# Patient Record
Sex: Male | Born: 1940 | Race: White | Hispanic: No | Marital: Married | Smoking: Never smoker
Health system: Southern US, Community
[De-identification: ages and names within clinical notes are randomized; demographics above are authoritative.]

## PROBLEM LIST (undated history)

## (undated) DIAGNOSIS — N400 Enlarged prostate without lower urinary tract symptoms: Secondary | ICD-10-CM

## (undated) DIAGNOSIS — Z9289 Personal history of other medical treatment: Secondary | ICD-10-CM

## (undated) DIAGNOSIS — E119 Type 2 diabetes mellitus without complications: Secondary | ICD-10-CM

## (undated) DIAGNOSIS — E785 Hyperlipidemia, unspecified: Secondary | ICD-10-CM

## (undated) DIAGNOSIS — I2510 Atherosclerotic heart disease of native coronary artery without angina pectoris: Secondary | ICD-10-CM

## (undated) DIAGNOSIS — E039 Hypothyroidism, unspecified: Secondary | ICD-10-CM

## (undated) DIAGNOSIS — E669 Obesity, unspecified: Secondary | ICD-10-CM

## (undated) DIAGNOSIS — M1990 Unspecified osteoarthritis, unspecified site: Secondary | ICD-10-CM

## (undated) DIAGNOSIS — G4730 Sleep apnea, unspecified: Secondary | ICD-10-CM

## (undated) HISTORY — DX: Type 2 diabetes mellitus without complications: E11.9

## (undated) HISTORY — DX: Hyperlipidemia, unspecified: E78.5

## (undated) HISTORY — PX: JOINT REPLACEMENT: SHX530

## (undated) HISTORY — DX: Personal history of other medical treatment: Z92.89

## (undated) HISTORY — DX: Atherosclerotic heart disease of native coronary artery without angina pectoris: I25.10

## (undated) HISTORY — DX: Benign prostatic hyperplasia without lower urinary tract symptoms: N40.0

## (undated) HISTORY — DX: Obesity, unspecified: E66.9

## (undated) HISTORY — PX: CORONARY ARTERY BYPASS GRAFT: SHX141

## (undated) HISTORY — DX: Hypothyroidism, unspecified: E03.9

---

## 1984-12-25 HISTORY — PX: LUMBAR SPINE SURGERY: SHX701

## 1991-07-27 HISTORY — PX: CARDIAC CATHETERIZATION: SHX172

## 1998-06-11 ENCOUNTER — Ambulatory Visit (HOSPITAL_COMMUNITY): Admission: RE | Admit: 1998-06-11 | Discharge: 1998-06-11 | Payer: Self-pay | Admitting: Cardiology

## 1998-11-25 ENCOUNTER — Ambulatory Visit (HOSPITAL_COMMUNITY): Admission: RE | Admit: 1998-11-25 | Discharge: 1998-11-26 | Payer: Self-pay | Admitting: Cardiology

## 1998-11-25 HISTORY — PX: CARDIAC CATHETERIZATION: SHX172

## 1998-12-23 ENCOUNTER — Encounter: Payer: Self-pay | Admitting: Cardiothoracic Surgery

## 1998-12-27 ENCOUNTER — Encounter: Payer: Self-pay | Admitting: Cardiothoracic Surgery

## 1998-12-27 ENCOUNTER — Inpatient Hospital Stay (HOSPITAL_COMMUNITY): Admission: RE | Admit: 1998-12-27 | Discharge: 1999-01-01 | Payer: Self-pay | Admitting: Cardiothoracic Surgery

## 1998-12-28 ENCOUNTER — Encounter: Payer: Self-pay | Admitting: Cardiothoracic Surgery

## 1998-12-29 ENCOUNTER — Encounter: Payer: Self-pay | Admitting: Cardiothoracic Surgery

## 1998-12-30 ENCOUNTER — Encounter: Payer: Self-pay | Admitting: Cardiothoracic Surgery

## 1999-02-01 ENCOUNTER — Encounter (HOSPITAL_COMMUNITY): Admission: RE | Admit: 1999-02-01 | Discharge: 1999-05-02 | Payer: Self-pay | Admitting: Cardiology

## 2006-09-10 DIAGNOSIS — Z9289 Personal history of other medical treatment: Secondary | ICD-10-CM

## 2006-09-10 HISTORY — DX: Personal history of other medical treatment: Z92.89

## 2007-02-10 ENCOUNTER — Emergency Department (HOSPITAL_COMMUNITY): Admission: EM | Admit: 2007-02-10 | Discharge: 2007-02-10 | Payer: Self-pay | Admitting: Emergency Medicine

## 2007-12-26 HISTORY — PX: KNEE ARTHROSCOPY: SHX127

## 2011-12-15 DIAGNOSIS — Z9289 Personal history of other medical treatment: Secondary | ICD-10-CM

## 2011-12-15 HISTORY — DX: Personal history of other medical treatment: Z92.89

## 2012-02-08 ENCOUNTER — Encounter: Payer: Self-pay | Admitting: *Deleted

## 2012-02-08 DIAGNOSIS — E7800 Pure hypercholesterolemia, unspecified: Secondary | ICD-10-CM | POA: Insufficient documentation

## 2012-02-08 DIAGNOSIS — E669 Obesity, unspecified: Secondary | ICD-10-CM | POA: Insufficient documentation

## 2012-02-08 DIAGNOSIS — E119 Type 2 diabetes mellitus without complications: Secondary | ICD-10-CM

## 2012-02-08 DIAGNOSIS — E1169 Type 2 diabetes mellitus with other specified complication: Secondary | ICD-10-CM | POA: Insufficient documentation

## 2012-02-08 DIAGNOSIS — E039 Hypothyroidism, unspecified: Secondary | ICD-10-CM

## 2012-02-08 DIAGNOSIS — N400 Enlarged prostate without lower urinary tract symptoms: Secondary | ICD-10-CM | POA: Insufficient documentation

## 2012-02-08 DIAGNOSIS — E785 Hyperlipidemia, unspecified: Secondary | ICD-10-CM

## 2012-02-08 DIAGNOSIS — I2510 Atherosclerotic heart disease of native coronary artery without angina pectoris: Secondary | ICD-10-CM | POA: Insufficient documentation

## 2012-02-24 ENCOUNTER — Ambulatory Visit (INDEPENDENT_AMBULATORY_CARE_PROVIDER_SITE_OTHER): Payer: BC Managed Care – PPO | Admitting: Family Medicine

## 2012-02-24 DIAGNOSIS — E039 Hypothyroidism, unspecified: Secondary | ICD-10-CM

## 2012-02-24 DIAGNOSIS — E119 Type 2 diabetes mellitus without complications: Secondary | ICD-10-CM

## 2012-02-24 DIAGNOSIS — I2510 Atherosclerotic heart disease of native coronary artery without angina pectoris: Secondary | ICD-10-CM

## 2012-02-24 DIAGNOSIS — J440 Chronic obstructive pulmonary disease with acute lower respiratory infection: Secondary | ICD-10-CM

## 2012-02-24 DIAGNOSIS — E785 Hyperlipidemia, unspecified: Secondary | ICD-10-CM

## 2012-02-24 LAB — COMPREHENSIVE METABOLIC PANEL
ALT: 23 U/L (ref 0–53)
AST: 20 U/L (ref 0–37)
Chloride: 102 mEq/L (ref 96–112)
Creat: 1.06 mg/dL (ref 0.50–1.35)
Sodium: 141 mEq/L (ref 135–145)
Total Bilirubin: 0.6 mg/dL (ref 0.3–1.2)
Total Protein: 6.9 g/dL (ref 6.0–8.3)

## 2012-02-24 LAB — POCT GLYCOSYLATED HEMOGLOBIN (HGB A1C): Hemoglobin A1C: 8.8

## 2012-02-24 LAB — LIPID PANEL
LDL Cholesterol: 73 mg/dL (ref 0–99)
Total CHOL/HDL Ratio: 3.4 Ratio
VLDL: 14 mg/dL (ref 0–40)

## 2012-02-24 LAB — TSH: TSH: 1.996 u[IU]/mL (ref 0.350–4.500)

## 2012-02-24 LAB — MICROALBUMIN, URINE: Microalb, Ur: 1.12 mg/dL (ref 0.00–1.89)

## 2012-02-24 MED ORDER — INSULIN LISPRO 100 UNIT/ML ~~LOC~~ SOLN: SUBCUTANEOUS | Status: DC

## 2012-02-24 NOTE — Progress Notes (Signed)
  Subjective:    Patient ID: Nathan Navarro, male    DOB: [birth year], 71 y.o.   MRN: [id number]  HPI  Patient presents in routine follow up of his multiple medical problems. Compliant with medications and denies side effects.  Plays golf almost daily.  Also here for surgical clearance of (L) knee replacement.  Cardiac clearance per [facility], Dr. Marland KitchenC" Colonoscopy negative(Dr. Elnoria Howard)  Review of Systems  Constitutional: Negative for unexpected weight change.  Cardiovascular: Negative for chest pain and leg swelling.  Neurological: Negative for headaches.       Objective:   Physical Exam  Constitutional: He is oriented to person, place, and time. He appears well-developed and well-nourished.  HENT:  Head: Normocephalic and atraumatic.  Neck: Neck supple. No JVD present. No thyromegaly present.  Cardiovascular: Normal rate, regular rhythm, normal heart sounds and intact distal pulses.   Pulmonary/Chest: Effort normal and breath sounds normal.  Abdominal: Soft. Normal appearance and bowel sounds are normal. There is no hepatosplenomegaly. There is no tenderness.  Musculoskeletal: Normal range of motion.  Neurological: He is alert and oriented to person, place, and time.  Skin: Skin is warm.     Psychiatric: He has a normal mood and affect.      Results for orders placed in visit on 02/24/12  POCT GLYCOSYLATED HEMOGLOBIN (HGB A1C)      Component Value Range   Hemoglobin A1C 8.8     visit    Assessment & Plan:   1. DM (diabetes mellitus)  POCT glycosylated hemoglobin (Hb A1C), Comprehensive metabolic panel, Lipid panel, Microalbumin, urine  2. Hypothyroid  TSH  3. CAD (coronary artery disease)    4. Dyslipidemia     Patient to check fasting BS and 2 hour post prandial and call with readings in 2 weeks.

## 2012-02-24 NOTE — Patient Instructions (Signed)
Start using 10 units of humalog insulin(a rapid acting insulin) when you sit down for your dinner meal. Call me with several fasting blood sugars and blood sugars that you check 2 hours after your dinner meal in 2 weeks.

## 2012-02-27 ENCOUNTER — Other Ambulatory Visit: Payer: Self-pay | Admitting: *Deleted

## 2012-02-27 MED ORDER — "PEN NEEDLES 5/16"" 31G X 8 MM MISC": Status: DC

## 2012-02-29 ENCOUNTER — Encounter: Payer: Self-pay | Admitting: Family Medicine

## 2012-03-09 ENCOUNTER — Ambulatory Visit: Payer: Medicare Other

## 2012-03-09 ENCOUNTER — Telehealth: Payer: Self-pay

## 2012-03-09 ENCOUNTER — Encounter (HOSPITAL_COMMUNITY): Payer: Self-pay | Admitting: Emergency Medicine

## 2012-03-09 ENCOUNTER — Emergency Department (HOSPITAL_COMMUNITY)
Admission: EM | Admit: 2012-03-09 | Discharge: 2012-03-09 | Disposition: A | Discharge status: Home / Self Care | Payer: Medicare Other | Attending: Emergency Medicine | Admitting: Emergency Medicine

## 2012-03-09 ENCOUNTER — Ambulatory Visit (INDEPENDENT_AMBULATORY_CARE_PROVIDER_SITE_OTHER): Payer: Medicare Other | Admitting: Emergency Medicine

## 2012-03-09 VITALS — BP 106/71 | HR 102 | Temp 101.0°F | Resp 18 | Ht 72.5 in | Wt 261.0 lb

## 2012-03-09 DIAGNOSIS — R509 Fever, unspecified: Secondary | ICD-10-CM

## 2012-03-09 DIAGNOSIS — R05 Cough: Secondary | ICD-10-CM

## 2012-03-09 DIAGNOSIS — Z951 Presence of aortocoronary bypass graft: Secondary | ICD-10-CM | POA: Insufficient documentation

## 2012-03-09 DIAGNOSIS — E119 Type 2 diabetes mellitus without complications: Secondary | ICD-10-CM

## 2012-03-09 DIAGNOSIS — J111 Influenza due to unidentified influenza virus with other respiratory manifestations: Secondary | ICD-10-CM | POA: Insufficient documentation

## 2012-03-09 DIAGNOSIS — E785 Hyperlipidemia, unspecified: Secondary | ICD-10-CM | POA: Insufficient documentation

## 2012-03-09 DIAGNOSIS — R059 Cough, unspecified: Secondary | ICD-10-CM

## 2012-03-09 DIAGNOSIS — I2510 Atherosclerotic heart disease of native coronary artery without angina pectoris: Secondary | ICD-10-CM | POA: Insufficient documentation

## 2012-03-09 DIAGNOSIS — R0902 Hypoxemia: Secondary | ICD-10-CM

## 2012-03-09 DIAGNOSIS — J40 Bronchitis, not specified as acute or chronic: Secondary | ICD-10-CM | POA: Insufficient documentation

## 2012-03-09 DIAGNOSIS — J101 Influenza due to other identified influenza virus with other respiratory manifestations: Secondary | ICD-10-CM

## 2012-03-09 DIAGNOSIS — E039 Hypothyroidism, unspecified: Secondary | ICD-10-CM | POA: Insufficient documentation

## 2012-03-09 DIAGNOSIS — N400 Enlarged prostate without lower urinary tract symptoms: Secondary | ICD-10-CM | POA: Insufficient documentation

## 2012-03-09 DIAGNOSIS — F17200 Nicotine dependence, unspecified, uncomplicated: Secondary | ICD-10-CM | POA: Insufficient documentation

## 2012-03-09 LAB — BASIC METABOLIC PANEL
BUN: 16 mg/dL (ref 6–23)
CO2: 31 mEq/L (ref 19–32)
Calcium: 8.9 mg/dL (ref 8.4–10.5)
Chloride: 95 mEq/L — ABNORMAL LOW (ref 96–112)
Creatinine, Ser: 1.18 mg/dL (ref 0.50–1.35)
GFR calc Af Amer: 71 mL/min — ABNORMAL LOW (ref >90)
GFR calc non Af Amer: 61 mL/min — ABNORMAL LOW (ref >90)
Glucose, Bld: 166 mg/dL — ABNORMAL HIGH (ref 70–99)
Potassium: 4.1 mEq/L (ref 3.5–5.1)
Sodium: 133 mEq/L — ABNORMAL LOW (ref 135–145)

## 2012-03-09 LAB — POCT CBC
Granulocyte percent: 68.7 %G (ref 37–80)
Hemoglobin: 15.3 g/dL (ref 14.1–18.1)
MCHC: 32.9 g/dL (ref 31.8–35.4)
MPV: 8.3 fL (ref 0–99.8)
POC Granulocyte: 4.6 (ref 2–6.9)
POC MID %: 10 %M (ref 0–12)
RBC: 5.04 M/uL (ref 4.69–6.13)

## 2012-03-09 LAB — DIFFERENTIAL
Basophils Absolute: 0 10*3/uL (ref 0.0–0.1)
Basophils Relative: 0 % (ref 0–1)
Eosinophils Absolute: 0.1 10*3/uL (ref 0.0–0.7)
Eosinophils Relative: 1 % (ref 0–5)
Lymphocytes Relative: 25 % (ref 12–46)
Lymphs Abs: 1.5 10*3/uL (ref 0.7–4.0)
Monocytes Absolute: 0.8 10*3/uL (ref 0.1–1.0)
Monocytes Relative: 14 % — ABNORMAL HIGH (ref 3–12)
Neutro Abs: 3.6 10*3/uL (ref 1.7–7.7)
Neutrophils Relative %: 60 % (ref 43–77)

## 2012-03-09 LAB — CBC
HCT: 43.2 % (ref 39.0–52.0)
Hemoglobin: 14.5 g/dL (ref 13.0–17.0)
MCH: 30.7 pg (ref 26.0–34.0)
MCHC: 33.6 g/dL (ref 30.0–36.0)
MCV: 91.3 fL (ref 78.0–100.0)
Platelets: 148 10*3/uL — ABNORMAL LOW (ref 150–400)
RBC: 4.73 MIL/uL (ref 4.22–5.81)
RDW: 13.1 % (ref 11.5–15.5)
WBC: 5.9 10*3/uL (ref 4.0–10.5)

## 2012-03-09 LAB — POCT INFLUENZA A/B: Influenza A, POC: NEGATIVE

## 2012-03-09 MED ORDER — SODIUM CHLORIDE 0.9 % IV SOLN: INTRAVENOUS | Status: DC

## 2012-03-09 MED ORDER — HYDROCOD POLST-CHLORPHEN POLST 10-8 MG/5ML PO LQCR: 5.0000 mL | Freq: Two times a day (BID) | ORAL | Status: DC | PRN

## 2012-03-09 MED ORDER — OSELTAMIVIR PHOSPHATE 75 MG PO CAPS: 75.0000 mg | ORAL_CAPSULE | Freq: Two times a day (BID) | ORAL | Status: AC

## 2012-03-09 MED ORDER — SODIUM CHLORIDE 0.9 % IV BOLUS (SEPSIS)
500.0000 mL | Freq: Once | INTRAVENOUS | Status: AC
  Administered 2012-03-09: 500 mL via INTRAVENOUS

## 2012-03-09 MED ORDER — ALBUTEROL SULFATE HFA 108 (90 BASE) MCG/ACT IN AERS
2.0000 | INHALATION_SPRAY | RESPIRATORY_TRACT | Status: DC | PRN
  Administered 2012-03-09: 2 via RESPIRATORY_TRACT
  Filled 2012-03-09: qty 6.7

## 2012-03-09 MED ORDER — BENZONATATE 200 MG PO CAPS: 200.0000 mg | ORAL_CAPSULE | Freq: Three times a day (TID) | ORAL | Status: AC | PRN

## 2012-03-09 MED ORDER — HYDROCOD POLST-CHLORPHEN POLST 10-8 MG/5ML PO LQCR
5.0000 mL | Freq: Once | ORAL | Status: AC
  Administered 2012-03-09: 5 mL via ORAL
  Filled 2012-03-09: qty 5

## 2012-03-09 MED ORDER — IBUPROFEN 800 MG PO TABS
800.0000 mg | ORAL_TABLET | Freq: Once | ORAL | Status: AC
  Administered 2012-03-09: 800 mg via ORAL
  Filled 2012-03-09: qty 1

## 2012-03-09 MED ORDER — IPRATROPIUM BROMIDE 0.02 % IN SOLN
0.5000 mg | Freq: Once | RESPIRATORY_TRACT | Status: AC
  Administered 2012-03-09: 0.5 mg via RESPIRATORY_TRACT
  Filled 2012-03-09: qty 2.5

## 2012-03-09 MED ORDER — ALBUTEROL SULFATE (5 MG/ML) 0.5% IN NEBU
5.0000 mg | INHALATION_SOLUTION | Freq: Once | RESPIRATORY_TRACT | Status: AC
  Administered 2012-03-09: 5 mg via RESPIRATORY_TRACT
  Filled 2012-03-09: qty 1

## 2012-03-09 NOTE — Telephone Encounter (Signed)
Pt was seen by dr Cleta Alberts this a.m. And has the flu. Just woke up and has a 103 fever. Wife called to get advice, b/c pt is a cardiac pt and wanted to know what is too high of a fever. Just took 2 tylenol.

## 2012-03-09 NOTE — ED Provider Notes (Signed)
History     CSN: [id number]  Arrival date & time 03/09/12  2008   First MD Initiated Contact with Patient 03/09/12 2026      Chief Complaint  Patient presents with  . Cough  . Fever    HPI: Patient is a 71 y.o. male presenting with cough and fever. The history is provided by the patient.  Cough This is a new problem. The current episode started more than 2 days ago. The problem occurs every few minutes. The problem has been gradually worsening. The cough is productive of sputum. The maximum temperature recorded prior to his arrival was 103 to 104 F. The fever has been present for 1 to 2 days. Associated symptoms include chills, sweats and myalgias. Pertinent negatives include no chest pain, no ear congestion, no ear pain, no headaches, no rhinorrhea, no sore throat, no shortness of breath and no wheezing. He is a smoker.  Fever Primary symptoms of the febrile illness include fever, cough and myalgias. Primary symptoms do not include headaches, wheezing or shortness of breath.  Pt reports 3 day hx of persistent fevers and cough. Was seen at PCP office today and had CXR, tested positive for influenza B and was placed on Tamiflu. At home this evening his cough became worse and he states his fever rec'd only brief relief w/ Tylenol and Ibuprofen. Pt is a smoker.  Past Medical History  Diagnosis Date  . Type 2 diabetes mellitus   . Unspecified hypothyroidism   . Other and unspecified hyperlipidemia   . Coronary artery disease   . BPH (benign prostatic hypertrophy)     Past Surgical History  Procedure Date  . Lumbar spine surgery   . Coronary artery bypass graft 2000    x 3  . Knee arthroscopy 2010    R knee    History reviewed. No pertinent family history.  History  Substance Use Topics  . Smoking status: Never Smoker   . Smokeless tobacco: Not on file  . Alcohol Use: Yes     occ      Review of Systems  Constitutional: Positive for fever and chills.  HENT: Negative.   Negative for ear pain, sore throat and rhinorrhea.   Eyes: Negative.   Respiratory: Positive for cough. Negative for shortness of breath and wheezing.   Cardiovascular: Negative.  Negative for chest pain.  Gastrointestinal: Negative.   Genitourinary: Negative.   Musculoskeletal: Positive for myalgias.  Skin: Negative.   Neurological: Negative.  Negative for headaches.  Hematological: Negative.   Psychiatric/Behavioral: Negative.     Allergies  Review of patient's allergies indicates no known allergies.  Home Medications   Current Outpatient Rx  Name Route Sig Dispense Refill  . ASPIRIN 81 MG PO TABS Oral Take 81 mg by mouth daily.    Marland Kitchen BENZONATATE 200 MG PO CAPS Oral Take 1 capsule (200 mg total) by mouth 3 (three) times daily as needed for cough. 30 capsule 0  . OMEGA-3 FATTY ACIDS 1000 MG PO CAPS Oral Take 2 g by mouth daily.    Marland Kitchen LANTUS SOLOSTAR [place] Subcutaneous Inject into the skin.    . INSULIN LISPRO (HUMAN) 100 UNIT/ML [place] SOLN  10 units prior to your dinner meal 10 mL 12  . PEN NEEDLES 5/16" 31G X 8 MM MISC  Use as directed with insulin pens 100 each 12  . LEVOTHYROXINE SODIUM 200 MCG PO TABS Oral Take 200 mcg by mouth daily.    Marland Kitchen METFORMIN HCL 1000  MG PO TABS Oral Take 1,000 mg by mouth 2 (two) times daily with a meal.    . METOPROLOL TARTRATE 50 MG PO TABS Oral Take 50 mg by mouth daily.     . OSELTAMIVIR PHOSPHATE 75 MG PO CAPS Oral Take 1 capsule (75 mg total) by mouth 2 (two) times daily. 10 capsule 0  . ROSUVASTATIN CALCIUM 20 MG PO TABS Oral Take 20 mg by mouth daily.    Marland Kitchen TADALAFIL 10 MG PO TABS Oral Take 10 mg by mouth daily as needed. For erectile      BP 121/59  Pulse 79  Temp(Src) 99.8 F (37.7 C) (Oral)  Resp 19  SpO2 96%  Physical Exam  Constitutional: He is oriented to person, place, and time. He appears well-developed and well-nourished.  HENT:  Head: Normocephalic and atraumatic.  Eyes: Conjunctivae are normal.  Neck: Neck supple.  Cardiovascular:  Normal rate and regular rhythm.   Pulmonary/Chest: Effort normal and breath sounds normal.       BBS coarse w/ exp wheezes.   Abdominal: Soft. Bowel sounds are normal.  Musculoskeletal: Normal range of motion.  Neurological: He is alert and oriented to person, place, and time.  Skin: Skin is warm and dry.  Psychiatric: He has a normal mood and affect.    ED Course  Procedures  Pt reports feeling much better after IVF's, med for cough and neb. BBS much improved. Findings and clinical impression discussed w/ pt. Will plan for d/c home w/ inhaler, med for cough and instruct pt to continue Tamiflu and f/u w/ PCP this week. Pt is agreeable w/ plan. I have discussed pt w. Dr Denton Lank who is agreement w/ plan.  Labs Reviewed  CBC - Abnormal; Notable for the following:    Platelets 148 (*)    All other components within normal limits  DIFFERENTIAL - Abnormal; Notable for the following:    Monocytes Relative 14 (*)    All other components within normal limits  BASIC METABOLIC PANEL - Abnormal; Notable for the following:    Sodium 133 (*)    Chloride 95 (*)    Glucose, Bld 166 (*)    GFR calc non Af Amer 61 (*)    GFR calc Af Amer 71 (*)    All other components within normal limits  URINALYSIS, ROUTINE W REFLEX MICROSCOPIC   Dg Chest 2 View  03/09/2012  [facility] *RADIOLOGY REPORT*  Clinical Data: Coronary artery bypass.  Cough  CHEST - 2 VIEW  Comparison: None.  Findings: Sternotomy wires overlie normal cardiac silhouette. There is some atelectasis at the left lung base.  Right lung bases clear.  No pleural edema.  No focal infiltrate.Degenerative osteophytosis of the thoracic spine.  IMPRESSION:  1. No acute cardiopulmonary process. 2.  Left basilar atelectasis appears chronic.  Original Report Authenticated By: Genevive Bi, M.D.     No diagnosis found.    MDM  HPI/PE and clinical findings c/w 1. Influenza-B (Positive screen in office today) 2. Bronchitis  (Associated w/ #1, pt is smoker, BBS improved w/ neb, d/c home w/ inhaler, CXR in office w/o acute findings) 3. Fever (99.8 at d/c after Ibuprofen and IVF's )        Leanne Chang, NP 03/10/12 [phone]

## 2012-03-09 NOTE — Patient Instructions (Signed)
Influenza, Adult Influenza ("the flu") is a viral infection of the respiratory tract. It causes chills, fever, cough, headache, body aches, and sore throat. Influenza in general will make you feel sicker than when you have a common cold. Symptoms of the illness typically last a few days. Cough and fatigue may continue for as long as 7 to 10 days. Influenza is highly contagious. It spreads easily to others in the droplets from coughs and sneezes. People frequently become infected by touching something that was recently contaminated with the virus and then touch their mouth, nose or eyes. This infection is caused by a virus. Symptoms will not be reduced or improved by taking an antibiotic. Antibiotics are medications that kill bacteria, not viruses. DIAGNOSIS  Diagnosis of influenza is often made based on the history and physical examination as well as the presence of influenza reports occurring in your community. Testing can be done if the diagnosis is not certain. TREATMENT  Since influenza is caused by a virus, antibiotics are not helpful. Your caregiver may prescribe antiviral medicines to shorten the illness and lessen the severity. Your caregiver may also recommend influenza vaccination and/or antiviral medicines for your family members in order to prevent the spread of influenza to them. HOME CARE INSTRUCTIONS  DO NOT GIVE ASPIRIN TO PERSONS WITH INFLUENZA WHO ARE UNDER 18 YEARS OF AGE. This could lead to brain and liver damage (Reye's syndrome). Read the label on over-the-counter medicines.   Stay home from work or school if at all possible until most of your symptoms are gone.   Only take over-the-counter or prescription medicines for pain, discomfort, or fever as directed by your caregiver.   Use a cool mist humidifier to increase air moisture. This will make breathing easier.   Rest until your temperature is nearly normal: 98.6 F (37 C). This usually takes 3 to 4 days. Be sure you get  plenty of rest.   Drink at least eight, eight-ounce glasses of fluids per day. Fluids include water, juice, broth, gelatin, or lemonade.   Cover your mouth and nose when coughing or sneezing and wash your hands often to prevent the spread of this virus to other persons.  PREVENTION  Annual influenza vaccination (flu shots) is the best way to avoid getting influenza. An annual flu shot is now routinely recommended for all adults in the [place] SEEK MEDICAL CARE IF:   You develop shortness of breath while resting.   You have a deep cough with production of mucous or chest pain.   You develop nausea (feeling sick to your stomach), vomiting, or diarrhea.  SEEK IMMEDIATE MEDICAL CARE IF:   You have difficulty breathing, become short of breath, or your skin or nails turn bluish.   You develop severe neck pain or stiffness.   You develop a severe headache, facial pain, or earache.   You have a fever.   You develop nausea or vomiting that cannot be controlled.  Document Released: 12/08/2000 Document Revised: 11/30/2011 Document Reviewed: 10/13/2009 [employer] Patient Information 2012 [employer]. 

## 2012-03-09 NOTE — ED Notes (Signed)
Per EMS, pt. Is from home with complaint of productive cough started on Tues. And with fever .denies of SOB, chest pain nor N/V.  Reported of having poor appetite. Alert and oriented x3.

## 2012-03-09 NOTE — Discharge Instructions (Signed)
Please review the instructions below. You was seen in the emergency department tonight for your persistent cough and fever. You tested positive for influenza B today. Your chest x-ray did not show any pneumonia. Continue the Tamiflu as directed by Dr Andee Poles. Continue to alternate Tylenol and Ibuprofen for bodyaches and fever. Use the inhaler, 2 inhalations every 4-6 hours as needed for cough, shortness of breath and or wheezing. Return if symptoms worsen, otherwise is aarrange follow up with Dr Hal Hope sometime this week for a recheck.      Bronchitis Bronchitis is a problem of the air tubes leading to your lungs. This problem makes it hard for air to get in and out of the lungs. You may cough a lot because your air tubes are narrow. Going without care can cause lasting (chronic) bronchitis. HOME CARE   Drink enough fluids to keep your pee (urine) clear or pale yellow.   Use a cool mist humidifier.   Quit smoking if you smoke. If you keep smoking, the bronchitis might not get better.   Only take medicine as told by your doctor.  GET HELP RIGHT AWAY IF:   Coughing keeps you awake.   You start to wheeze.   You become more sick or weak.   You have a hard time breathing or get short of breath.   You cough up blood.   Coughing lasts more than 2 weeks.   You have a fever.   Your baby is older than 3 months with a rectal temperature of 102 F (38.9 C) or higher.   Your baby is 85 months old or younger with a rectal temperature of 100.4 F (38 C) or higher.  MAKE SURE YOU:  Understand these instructions.   Will watch your condition.   Will get help right away if you are not doing well or get worse.  Document Released: 05/29/2008 Document Revised: 11/30/2011 Document Reviewed: 11/12/2009 [facility] Patient Information 2012 [place], [place].Fever, Adult A fever is a temperature of 100.4 F (38 C) or above.  HOME CARE  Take fever medicine as told by your doctor. Do not  take aspirin  for fever if you are younger than 70 years of age.   If you are given antibiotic medicine, take it as told. Finish the medicine even if you start to feel better.   Rest.   Drink enough fluids to keep your pee (urine) clear or pale yellow. Do not drink alcohol.   Take a bath or shower with room temperature water. Do not use ice water or alcohol sponge baths.   Wear lightweight, loose clothes.  GET HELP RIGHT AWAY IF:   You are short of breath or have trouble breathing.   You are very weak.   You are dizzy or you pass out (faint).   You are very thirsty or are making little or no urine.   You have new pain.   You throw up (vomit) or have watery poop (diarrhea).   You keep throwing up or having watery poop for more than 1 to 2 days.   You have a stiff neck or light bothers your eyes.   You have a skin rash.   You have a fever or problems (symptoms) that last for more than 2 to 3 days.   You have a fever and your problems quickly get worse.   You keep throwing up the fluids you drink.   You do not feel better after 3 days.   You have  new problems.  MAKE SURE YOU:   Understand these instructions.   Will watch your condition.   Will get help right away if you are not doing well or get worse.  Document Released: 09/19/2008 Document Revised: 11/30/2011 Document Reviewed: 10/12/2011 [facility] Patient Information 2012 [place], [place].Influenza, Adult Influenza (flu) is an infection caused by a germ. It starts suddenly, usually with a fever. It causes chills, dry and hacking cough, headache, body aches, and sore throat. Influenza spreads easily from one person to another.  HOME CARE   Only take medicines as told by your doctor.   Rest.   Drink enough fluids to keep your pee (urine) clear or pale yellow.   Wash your hands often. Do this after you blow your nose, after you go to the bathroom, and before you touch food.  GET HELP RIGHT AWAY IF:   You have shortness of  breath while resting.   You have pain or pressure in the chest or belly (abdomen).   You suddenly feel dizzy.   You feel confused.   You have a hard time breathing.   Your skin or nails turn bluish in color.   You get a bad neck pain or stiffness.   You get a bad headache, face pain, or earache.   You throw up (vomit) a lot and often.   You have a fever.  MAKE SURE YOU:   Understand these instructions.   Will watch your condition.   Will get help right away if you are not doing well or get worse.  Document Released: 09/19/2008 Document Revised: 11/30/2011 Document Reviewed: 09/19/2008 [facility] Patient Information 2012 [place], [place].

## 2012-03-09 NOTE — Progress Notes (Signed)
  Subjective:    Patient ID: Nathan Navarro, male    DOB: 1941/09/02, 71 y.o.   MRN: [id number]  HPI patient enters with a 4 to five-day history of congestion sore throat and cough. Patient has been on symptomatic treatment. He is having a lot of nasal congestion associated with the cough. He has had a productive cough. He has also had purulent drainage from his nose.    Review of Systems patient has insulin-dependent diabetes. Is on insulin for sugar control.     Objective:   Physical Exam  Constitutional: He appears well-developed and well-nourished.  HENT:  Right Ear: External ear normal.  Left Ear: External ear normal.  Eyes: Pupils are equal, round, and reactive to light.  Neck: No tracheal deviation present. No thyromegaly present.  Cardiovascular: Normal rate.   Pulmonary/Chest: No respiratory distress. He has no wheezes.       There are rhonchi noted throughout both lower lobes. There is no dullness to percussion.  Lymphadenopathy:    He has no cervical adenopathy.    [facility] reading (PRIMARY] Dr. Cleta Alberts patient is status post coronary artery bypass surgery. On the PA view there is some thickening of the pleura on the left. There is no definite infiltrate seen.       Assessment & Plan:  Patient presents with flulike symptoms with possible secondary bronchitis. He may also have a sinus infection. Apparently everyone in his family has been ill.

## 2012-03-09 NOTE — Telephone Encounter (Signed)
Dr. Cleta Alberts, pls advise... ER?

## 2012-03-09 NOTE — ED Notes (Deleted)
Per EMS , pt. Is from home with complaint of productive cough for six days now w/ fever, denies SOB, N/V, no chest pain but claimed of poor appetite.A/O x4.

## 2012-03-09 NOTE — ED Notes (Signed)
RUE:[id number]<JW> Expected date:03/09/12<BR> Expected time: 7:32 PM<BR> Means of arrival:Ambulance<BR> Comments:<BR> M211, Fever, flu-s/s, 71 yo m

## 2012-03-10 NOTE — ED Provider Notes (Signed)
Medical screening examination/treatment/procedure(s) were conducted as a shared visit with non-physician practitioner(s) and myself.  I personally evaluated the patient during the encounter Pt with non prod cough. subj fever. Went to md today, cxr no infiltrate, flu test pos. Single neb in ed with improvement symptoms, breathing comfortably.   Suzi Roots, MD 03/10/12 [phone]

## 2012-03-15 MED ORDER — LEVOTHYROXINE SODIUM 200 MCG PO TABS: 200.0000 ug | ORAL_TABLET | Freq: Every day | ORAL | Status: DC

## 2012-03-15 NOTE — Telephone Encounter (Signed)
Pt had bout with flu and feels much better beside the coughing and congestion he is requesting to see if Dr Cleta Alberts will call rx in for him, he also would like Dr Hal Hope to refill his rx synthroid thru [facility] he has 3 refills left.

## 2012-03-15 NOTE — Telephone Encounter (Signed)
Dr. Cleta Alberts, can we rx anything for continued cough and congestion?  You saw him 3/16

## 2012-03-15 NOTE — Telephone Encounter (Signed)
Thyroid med refilled for one year and sent to [facility].

## 2012-03-16 NOTE — Telephone Encounter (Signed)
Advised pt of rx.  rx for Tussionex called in

## 2012-03-16 NOTE — Telephone Encounter (Signed)
We can call in medication for cough. He initially had a prescription for Tussionex that would be okay to take 1 teaspoon twice a day #3 ounces. Or we can call him Tessalon Perles.

## 2012-03-20 ENCOUNTER — Ambulatory Visit (INDEPENDENT_AMBULATORY_CARE_PROVIDER_SITE_OTHER): Payer: Medicare Other | Admitting: Family Medicine

## 2012-03-20 VITALS — BP 144/80 | HR 84 | Temp 99.0°F | Resp 18 | Ht 72.5 in | Wt 239.0 lb

## 2012-03-20 DIAGNOSIS — B349 Viral infection, unspecified: Secondary | ICD-10-CM

## 2012-03-20 DIAGNOSIS — B9789 Other viral agents as the cause of diseases classified elsewhere: Secondary | ICD-10-CM

## 2012-03-20 DIAGNOSIS — E119 Type 2 diabetes mellitus without complications: Secondary | ICD-10-CM

## 2012-03-20 LAB — POCT CBC
HCT, POC: 45 % (ref 43.5–53.7)
Hemoglobin: 15 g/dL (ref 14.1–18.1)
MPV: 8.1 fL (ref 0–99.8)
POC Granulocyte: 6 (ref 2–6.9)
POC MID %: 8.1 %M (ref 0–12)
RBC: 4.9 M/uL (ref 4.69–6.13)

## 2012-03-20 LAB — GLUCOSE, POCT (MANUAL RESULT ENTRY): POC Glucose: 113

## 2012-03-20 NOTE — Progress Notes (Signed)
  Subjective:    Patient ID: Nathan Navarro, male    DOB: [birth year], 71 y.o.   MRN: [id number]  HPI  Presents in follow up of OV and ER visit for Influenza B. Patient is improving though still with some chest congestion.  Denies SOB/DOE Non smoker  When ill blood sugars spiked into the 300's Blood sugars have trended down. Fasting BS's in the 100's; non fasting BS's in the 200's. Has had polyuria though last night slept uninterrupted  TKR 04/22/12.  Review of Systems     Objective:   Physical Exam  Constitutional: He appears well-developed.  HENT:  Head: Normocephalic.  Right Ear: External ear normal.  Left Ear: External ear normal.  Nose: Nose normal.  Mouth/Throat: Oropharynx is clear and moist.  Neck: Neck supple.  Cardiovascular: Normal rate, regular rhythm and normal heart sounds.   Pulmonary/Chest: Effort normal and breath sounds normal.  Lymphadenopathy:    He has no cervical adenopathy.  Skin: Skin is warm.  Psychiatric: He has a normal mood and affect.      Results for orders placed in visit on 03/20/12  POCT CBC      Component Value Range   WBC 9.4  4.6 - 10.2 (K/uL)   Lymph, poc 2.7  0.6 - 3.4    POC LYMPH PERCENT 28.5  10 - 50 (%L)   MID (cbc) 0.8  0 - 0.9    POC MID % 8.1  0 - 12 (%M)   POC Granulocyte 6.0  2 - 6.9    Granulocyte percent 63.4  37 - 80 (%G)   RBC 4.90  4.69 - 6.13 (M/uL)   Hemoglobin 15.0  14.1 - 18.1 (g/dL)   HCT, POC 40.9  81.1 - 53.7 (%)   MCV 91.8  80 - 97 (fL)   MCH, POC 30.6  27 - 31.2 (pg)   MCHC 33.3  31.8 - 35.4 (g/dL)   RDW, POC 91.4     Platelet Count, POC 370  142 - 424 (K/uL)   MPV 8.1  0 - 99.8 (fL)  GLUCOSE, POCT (MANUAL RESULT ENTRY)      Component Value Range   POC Glucose 113         Assessment & Plan:   1. Viral illness  POCT CBC  2. IDDM (insulin dependent diabetes mellitus)  POCT glucose (manual entry)   Anticipatory guidance regarding recent influenza illness and reactive hyperglycemia Encouraged  continued efforts with healthy weight loss. Discussed meal time(humalog) SSI Patient cleared for Orthopedic Surgery. He will call if blood sugars trend back up.  Follow up 3 months

## 2012-04-10 ENCOUNTER — Encounter (HOSPITAL_COMMUNITY): Payer: Self-pay | Admitting: Pharmacy Technician

## 2012-04-15 ENCOUNTER — Encounter (HOSPITAL_COMMUNITY): Payer: Self-pay

## 2012-04-15 ENCOUNTER — Ambulatory Visit (HOSPITAL_COMMUNITY)
Admission: RE | Admit: 2012-04-15 | Discharge: 2012-04-15 | Disposition: A | Discharge status: Home / Self Care | Payer: Medicare Other | Source: Ambulatory Visit | Attending: Orthopedic Surgery | Admitting: Orthopedic Surgery

## 2012-04-15 ENCOUNTER — Encounter (HOSPITAL_COMMUNITY)
Admission: RE | Admit: 2012-04-15 | Discharge: 2012-04-15 | Disposition: A | Discharge status: Home / Self Care | Payer: Medicare Other | Source: Ambulatory Visit | Attending: Orthopedic Surgery | Admitting: Orthopedic Surgery

## 2012-04-15 DIAGNOSIS — Z01818 Encounter for other preprocedural examination: Secondary | ICD-10-CM | POA: Insufficient documentation

## 2012-04-15 DIAGNOSIS — Z01812 Encounter for preprocedural laboratory examination: Secondary | ICD-10-CM | POA: Insufficient documentation

## 2012-04-15 HISTORY — DX: Sleep apnea, unspecified: G47.30

## 2012-04-15 LAB — COMPREHENSIVE METABOLIC PANEL
AST: 18 U/L (ref 0–37)
Albumin: 4 g/dL (ref 3.5–5.2)
Alkaline Phosphatase: 47 U/L (ref 39–117)
BUN: 13 mg/dL (ref 6–23)
Potassium: 4.7 mEq/L (ref 3.5–5.1)
Sodium: 138 mEq/L (ref 135–145)
Total Protein: 7.4 g/dL (ref 6.0–8.3)

## 2012-04-15 LAB — SURGICAL PCR SCREEN
MRSA, PCR: NEGATIVE
Staphylococcus aureus: NEGATIVE

## 2012-04-15 LAB — URINALYSIS, ROUTINE W REFLEX MICROSCOPIC
Glucose, UA: NEGATIVE mg/dL
Hgb urine dipstick: NEGATIVE
Ketones, ur: NEGATIVE mg/dL
Leukocytes, UA: NEGATIVE
pH: 6 (ref 5.0–8.0)

## 2012-04-15 LAB — CBC
MCV: 92.2 fL (ref 78.0–100.0)
Platelets: 188 10*3/uL (ref 150–400)
RBC: 4.98 MIL/uL (ref 4.22–5.81)
RDW: 14.2 % (ref 11.5–15.5)
WBC: 6.7 10*3/uL (ref 4.0–10.5)

## 2012-04-15 LAB — PROTIME-INR: Prothrombin Time: 13.6 seconds (ref 11.6–15.2)

## 2012-04-15 LAB — APTT: aPTT: 33 seconds (ref 24–37)

## 2012-04-15 NOTE — Patient Instructions (Signed)
20 Nathan Navarro  04/15/2012   Your procedure is scheduled on: Monday 04-22-2012   Report to [facility] at 0730  AM.  Call this number if you have problems the morning of surgery: [phone]   Remember:   Do not eat food or drink liquids:After Midnight.  .  Take these medicines the morning of surgery with A SIP OF WATER: synthroid, bystolic, crestor   Do not wear jewelry or make up.  Do not wear lotions, powders, or perfumes.Do not wear deodorant.    Do not bring valuables to the hospital.  Contacts, dentures or bridgework may not be worn into surgery.  Leave suitcase in the car. After surgery it may be brought to your room.  For patients admitted to the hospital, checkout time is 11:00 AM the day of discharge.     Special Instructions: CHG Shower Use Special Wash: 1/2 bottle night before surgery and 1/2 bottle morning of surgery.neck down avoid private area   Please read over the following fact sheets that you were given: MRSA Information, blood fact sheet,in centive spironmeter fact sheet  Cain Sieve [facility] pre op nurse phone number [phone], call if needed

## 2012-04-21 ENCOUNTER — Other Ambulatory Visit: Payer: Self-pay | Admitting: Orthopedic Surgery

## 2012-04-21 MED ORDER — BUPIVACAINE 0.25 % ON-Q PUMP SINGLE CATH 300ML
300.0000 mL | INJECTION | Status: DC
  Filled 2012-04-21: qty 300

## 2012-04-21 NOTE — H&P (Signed)
Nathan Navarro  DOB: 05/08/1941 Married / Language: English / Race: White / Male  Date of Admission:  04/22/2012  Chief Complaint:  Left Knee Pain  History of Present Illness The patient is a 70 year old male who comes infor a preoperative History and Physical. The patient is scheduled for a left total knee arthroplasty to be performed by Dr. Frank V. Aluisio, MD at [facility] on 04/22/2012. The patient is a 70 year old male who presents with knee complaints. The patient was seen today for a second opinion. The patient reports left knee and right knee symptoms including: pain which began 5 year(s) ago without any known injury. Note for "Knee pain": His pain has worsened over time. He has been told he had to have a total knee replacement, and wanted to come here to have the surgery done by someone he trusts. He states that he has had bilateral knee pain for several years but in the last five years it has been progressively worse. In the last year he has begun to have pain at rest, in addition to pain walking up and down stairs and standing from seated position. He denies locking or catching symptoms. At the current time, he reports that the left knee is causing him more pain. He has been to Dr. Kramer at [facility], where he received cortisone injections and viscosupplementation. He had no relief with the gel shots but was pain free for 2-3 months with the cortisone injections. He presents today ready for total knee replacements, the left knee first. They have been treated conservatively in the past for the above stated problem and despite conservative measures, they continue to have progressive pain and severe functional limitations and dysfunction. They have failed non-operative management. It is felt that they would benefit from undergoing total joint replacement. Risks and benefits of the procedure have been discussed with the patient and they elect to proceed with surgery. There are  no active contraindications to surgery such as ongoing infection or rapidly progressive neurological disease.   Medication History MetFORMIN HCl (1000MG Tablet, Oral) Active. Crestor (40MG Tablet, Oral) Active. Synthroid (200MCG Tablet, Oral) Active. Bystolic (5MG Tablet, Oral) Active. Aspirin EC (81MG Tablet DR, Oral) Active. Fish Oil Burp-Less (1000MG Capsule, Oral) Active. Multivitamins ( Oral) Active. Lantus SoloStar (100UNIT/ML Solution, Subcutaneous) Active. HumaLOG (100UNIT/ML Solution, Subcutaneous) Active.  Problem List/Past Medical Impaired Hearing. uses hearing bilateral hearing aids Coronary artery disease Diabetes Mellitus, Type II  Past Surgical History Arthroscopy of Knee - right - 2009 Coronary Artery Bypass Graft. 4 or more vessels, 1981 (3), 2004 (4) Spinal Surgery - 1986  Family History Congestive Heart Failure. mother Diabetes Mellitus. mother and sister Heart Disease. mother, father, sister and brother Heart disease in male family member before age 55 Hypertension. mother Osteoarthritis. mother  Social History Alcohol use. current drinker; drinks beer, wine and hard liquor; only occasionally per week Children. 2 Current work status. retired Drug/Alcohol Rehab (Currently). no Drug/Alcohol Rehab (Previously). no Exercise. Exercises daily; does running / walking, individual sport and gym / weights Illicit drug use. no Living situation. live with spouse Marital status. married Number of flights of stairs before winded. greater than 5 Pain Contract. no Tobacco / smoke exposure. no Tobacco use. Never smoker. never smoker  Review of Systems General:Present- Chills, Fever, Night Sweats, Fatigue, Feeling sick and Weight Loss. Not Present- Appetite Loss and Weight Gain. Skin:Not Present- Itching, Rash, Skin Color Changes, Ulcer, Psoriasis and Change in Hair or Nails. HEENT:Not   Present- Sensitivity to light, Hearing problems,  Nose Bleed and Ringing in the Ears. Neck:Not Present- Swollen Glands and Neck Mass. Respiratory:Present- Chronic Cough. Not Present- Snoring, Bloody sputum and Dyspnea. Cardiovascular:Not Present- Shortness of Breath, Chest Pain, Swelling of Extremities, Leg Cramps and Palpitations. Gastrointestinal:Not Present- Bloody Stool, Heartburn, Abdominal Pain, Vomiting, Nausea and Incontinence of Stool. Male Genitourinary:Not Present- Blood in Urine, Frequency, Incontinence and Nocturia. Musculoskeletal:Present- Joint Stiffness, Joint Swelling and Joint Pain. Not Present- Muscle Weakness, Muscle Pain and Back Pain. Neurological:Not Present- Tingling, Numbness, Burning, Tremor, Headaches and Dizziness. Psychiatric:Not Present- Anxiety, Depression and Memory Loss. Endocrine:Not Present- Cold Intolerance, Heat Intolerance, Excessive hunger and Excessive Thirst. Hematology:Not Present- Abnormal Bleeding, Anemia, Blood Clots and Easy Bruising.  Vitals Weight: 250 lb Height: 74 in Body Surface Area: 2.43 m Body Mass Index: 32.1 kg/m Pulse: 68 (Regular) Resp.: 14 (Unlabored) BP: 124/80 (Sitting, Right Arm, Standard)  Physical Exam The physical exam findings are as follows:   General Mental Status - Alert, cooperative and good historian. General Appearance- pleasant. Not in acute distress. Orientation- Oriented X3. Build & Nutrition- Well nourished and Well developed.   Head and Neck Head- normocephalic, atraumatic . Neck Global Assessment- supple. no bruit auscultated on the right and no bruit auscultated on the left.   Eye Pupil- Bilateral- Regular and Round. Motion- Bilateral- EOMI.   Chest and Lung Exam Auscultation: Breath sounds:- clear at anterior chest wall and - clear at posterior chest wall. Adventitious sounds:- No Adventitious sounds.   Cardiovascular Auscultation:Rhythm- Regular rate and rhythm. Heart Sounds- S1 WNL and S2  WNL. Murmurs & Other Heart Sounds:Auscultation of the heart reveals - No Murmurs.   Abdomen Palpation/Percussion:Tenderness- Abdomen is non-tender to palpation. Rigidity (guarding)- Abdomen is soft. Auscultation:Auscultation of the abdomen reveals - Bowel sounds normal.   Male Genitourinary Not done, not pertinent to present illness  Musculoskeletal On exam a well developed male, alert and oriented in no apparent distress. Both hips show normal range of motion with no discomfort. The left knee shows about a 5 degree flexion contracture with flexion to 125. There was moderate crepitus on range of motion. He has slight tenderness medial. There is no lateral tenderness. There is no instability noted. Right knee 0 to 125. No swelling. Slight tenderness medial greater than lateral. No instability noted. There is moderate crepitus on range of motion. Pulse sensation motor intact both lower extremities. He has slow gait, but it is a steady gait pattern.  RADIOGRAPHS: AP both knees and lateral show advanced endstage arthritis medial and patellofemoral compartments of both knees with bone on bone and medial and patellofemoral both. He also has slight varus and has large osteophytes.  Assessment & Plan Osteoarthritis Left Knee  Patient is for a Left Total Knee Replacement by Dr. Aluisio.  Plan is to go home after the hospital stay.  Difficulty hearing - uses hearing aids, may not have in after surgery. Please place sign in room and/or on door of hospital room.  PCP- Dr. Karen Ritcher - The patient has been seen preoperatively and felt to be stable for surgery. Cards - Dr. Croitoru - [facility] - The patient has been seen preoperatively and it is felt to be low risk of CV complications.  Drew Brithney Bensen, PA-C  

## 2012-04-22 ENCOUNTER — Encounter (HOSPITAL_COMMUNITY): Payer: Self-pay | Admitting: Anesthesiology

## 2012-04-22 ENCOUNTER — Encounter (HOSPITAL_COMMUNITY)
Admission: RE | Disposition: A | Discharge status: Home Health | Payer: Self-pay | Source: Ambulatory Visit | Attending: Orthopedic Surgery

## 2012-04-22 ENCOUNTER — Encounter (HOSPITAL_COMMUNITY): Payer: Self-pay | Admitting: *Deleted

## 2012-04-22 ENCOUNTER — Inpatient Hospital Stay (HOSPITAL_COMMUNITY)
Admission: RE | Admit: 2012-04-22 | Discharge: 2012-04-24 | DRG: 470 | Disposition: A | Discharge status: Home Health | Payer: Medicare Other | Source: Ambulatory Visit | Attending: Orthopedic Surgery | Admitting: Orthopedic Surgery

## 2012-04-22 ENCOUNTER — Ambulatory Visit (HOSPITAL_COMMUNITY): Payer: Medicare Other | Admitting: Anesthesiology

## 2012-04-22 DIAGNOSIS — Z951 Presence of aortocoronary bypass graft: Secondary | ICD-10-CM

## 2012-04-22 DIAGNOSIS — E039 Hypothyroidism, unspecified: Secondary | ICD-10-CM | POA: Diagnosis present

## 2012-04-22 DIAGNOSIS — E119 Type 2 diabetes mellitus without complications: Secondary | ICD-10-CM | POA: Diagnosis present

## 2012-04-22 DIAGNOSIS — M1710 Unilateral primary osteoarthritis, unspecified knee: Principal | ICD-10-CM | POA: Diagnosis present

## 2012-04-22 DIAGNOSIS — H9190 Unspecified hearing loss, unspecified ear: Secondary | ICD-10-CM | POA: Diagnosis present

## 2012-04-22 DIAGNOSIS — Z96659 Presence of unspecified artificial knee joint: Secondary | ICD-10-CM

## 2012-04-22 DIAGNOSIS — M179 Osteoarthritis of knee, unspecified: Secondary | ICD-10-CM | POA: Diagnosis present

## 2012-04-22 DIAGNOSIS — I2510 Atherosclerotic heart disease of native coronary artery without angina pectoris: Secondary | ICD-10-CM | POA: Diagnosis present

## 2012-04-22 HISTORY — PX: TOTAL KNEE ARTHROPLASTY: SHX125

## 2012-04-22 LAB — TYPE AND SCREEN
ABO/RH(D): B POS
Antibody Screen: NEGATIVE

## 2012-04-22 LAB — GLUCOSE, CAPILLARY: Glucose-Capillary: 273 mg/dL — ABNORMAL HIGH (ref 70–99)

## 2012-04-22 SURGERY — ARTHROPLASTY, KNEE, TOTAL
Anesthesia: Spinal | Site: Knee | Laterality: Left | Wound class: Clean

## 2012-04-22 MED ORDER — BUPIVACAINE 0.25 % ON-Q PUMP SINGLE CATH 300ML
INJECTION | Status: DC | PRN
  Administered 2012-04-22: 300 mL

## 2012-04-22 MED ORDER — DOCUSATE SODIUM 100 MG PO CAPS
100.0000 mg | ORAL_CAPSULE | Freq: Two times a day (BID) | ORAL | Status: DC
  Administered 2012-04-22 – 2012-04-24 (×5): 100 mg via ORAL
  Filled 2012-04-22 (×6): qty 1

## 2012-04-22 MED ORDER — CEFAZOLIN SODIUM-DEXTROSE 2-3 GM-% IV SOLR
2.0000 g | INTRAVENOUS | Status: AC
  Administered 2012-04-22: 2 g via INTRAVENOUS

## 2012-04-22 MED ORDER — CEFAZOLIN SODIUM 1-5 GM-% IV SOLN
1.0000 g | Freq: Four times a day (QID) | INTRAVENOUS | Status: AC
  Administered 2012-04-22 – 2012-04-23 (×3): 1 g via INTRAVENOUS
  Filled 2012-04-22 (×4): qty 50

## 2012-04-22 MED ORDER — HYDROMORPHONE 0.3 MG/ML IV SOLN
INTRAVENOUS | Status: DC
  Administered 2012-04-22: 12:00:00 via INTRAVENOUS
  Administered 2012-04-22: 4 mg via INTRAVENOUS
  Administered 2012-04-23: 0.599 mg via INTRAVENOUS

## 2012-04-22 MED ORDER — TEMAZEPAM 15 MG PO CAPS: 15.0000 mg | ORAL_CAPSULE | Freq: Every evening | ORAL | Status: DC | PRN

## 2012-04-22 MED ORDER — ACETAMINOPHEN 10 MG/ML IV SOLN
INTRAVENOUS | Status: AC
  Filled 2012-04-22: qty 100

## 2012-04-22 MED ORDER — METOCLOPRAMIDE HCL 5 MG/ML IJ SOLN: 5.0000 mg | Freq: Three times a day (TID) | INTRAMUSCULAR | Status: DC | PRN

## 2012-04-22 MED ORDER — ONDANSETRON HCL 4 MG/2ML IJ SOLN: 4.0000 mg | Freq: Four times a day (QID) | INTRAMUSCULAR | Status: DC | PRN

## 2012-04-22 MED ORDER — HYDROMORPHONE HCL PF 1 MG/ML IJ SOLN: 0.2500 mg | INTRAMUSCULAR | Status: DC | PRN

## 2012-04-22 MED ORDER — MIDAZOLAM HCL 5 MG/5ML IJ SOLN
INTRAMUSCULAR | Status: DC | PRN
  Administered 2012-04-22: 2 mg via INTRAVENOUS
  Administered 2012-04-22 (×2): 1 mg via INTRAVENOUS

## 2012-04-22 MED ORDER — FLEET ENEMA 7-19 GM/118ML RE ENEM: 1.0000 | ENEMA | Freq: Once | RECTAL | Status: AC | PRN

## 2012-04-22 MED ORDER — INSULIN GLARGINE 100 UNIT/ML ~~LOC~~ SOLN
40.0000 [IU] | Freq: Every day | SUBCUTANEOUS | Status: DC
  Administered 2012-04-23 – 2012-04-24 (×2): 40 [IU] via SUBCUTANEOUS

## 2012-04-22 MED ORDER — LEVOTHYROXINE SODIUM 200 MCG PO TABS
200.0000 ug | ORAL_TABLET | Freq: Every day | ORAL | Status: DC
  Administered 2012-04-23 – 2012-04-24 (×2): 200 ug via ORAL
  Filled 2012-04-22 (×4): qty 1

## 2012-04-22 MED ORDER — INSULIN ASPART 100 UNIT/ML ~~LOC~~ SOLN
10.0000 [IU] | Freq: Every day | SUBCUTANEOUS | Status: DC
  Administered 2012-04-23 – 2012-04-24 (×2): 10 [IU] via SUBCUTANEOUS

## 2012-04-22 MED ORDER — PROPOFOL 10 MG/ML IV EMUL
INTRAVENOUS | Status: DC | PRN
  Administered 2012-04-22: 75 ug/kg/min via INTRAVENOUS

## 2012-04-22 MED ORDER — EPHEDRINE SULFATE 50 MG/ML IJ SOLN
INTRAMUSCULAR | Status: DC | PRN
  Administered 2012-04-22 (×2): 10 mg via INTRAVENOUS

## 2012-04-22 MED ORDER — ACETAMINOPHEN 10 MG/ML IV SOLN
1000.0000 mg | Freq: Four times a day (QID) | INTRAVENOUS | Status: DC
  Administered 2012-04-22: 1000 mg via INTRAVENOUS

## 2012-04-22 MED ORDER — PHENOL 1.4 % MT LIQD: 1.0000 | OROMUCOSAL | Status: DC | PRN

## 2012-04-22 MED ORDER — ACETAMINOPHEN 650 MG RE SUPP: 650.0000 mg | Freq: Four times a day (QID) | RECTAL | Status: DC | PRN

## 2012-04-22 MED ORDER — SODIUM CHLORIDE 0.9 % IR SOLN
Status: DC | PRN
  Administered 2012-04-22: 3000 mL

## 2012-04-22 MED ORDER — DEXAMETHASONE SODIUM PHOSPHATE 10 MG/ML IJ SOLN
10.0000 mg | Freq: Once | INTRAMUSCULAR | Status: AC
  Administered 2012-04-22: 10 mg via INTRAVENOUS

## 2012-04-22 MED ORDER — ATORVASTATIN CALCIUM 40 MG PO TABS
40.0000 mg | ORAL_TABLET | Freq: Every day | ORAL | Status: DC
  Administered 2012-04-23: 40 mg via ORAL
  Filled 2012-04-22 (×3): qty 1

## 2012-04-22 MED ORDER — BISACODYL 10 MG RE SUPP: 10.0000 mg | Freq: Every day | RECTAL | Status: DC | PRN

## 2012-04-22 MED ORDER — LEVOTHYROXINE SODIUM 200 MCG PO TABS
200.0000 ug | ORAL_TABLET | Freq: Every morning | ORAL | Status: DC
  Filled 2012-04-22: qty 1

## 2012-04-22 MED ORDER — METOCLOPRAMIDE HCL 10 MG PO TABS: 5.0000 mg | ORAL_TABLET | Freq: Three times a day (TID) | ORAL | Status: DC | PRN

## 2012-04-22 MED ORDER — POLYETHYLENE GLYCOL 3350 17 G PO PACK
17.0000 g | PACK | Freq: Every day | ORAL | Status: DC | PRN
  Filled 2012-04-22: qty 1

## 2012-04-22 MED ORDER — MENTHOL 3 MG MT LOZG
1.0000 | LOZENGE | OROMUCOSAL | Status: DC | PRN
  Filled 2012-04-22: qty 9

## 2012-04-22 MED ORDER — METFORMIN HCL 500 MG PO TABS
1000.0000 mg | ORAL_TABLET | Freq: Two times a day (BID) | ORAL | Status: DC
  Administered 2012-04-22: 1000 mg via ORAL
  Filled 2012-04-22 (×4): qty 2

## 2012-04-22 MED ORDER — FENTANYL CITRATE 0.05 MG/ML IJ SOLN
INTRAMUSCULAR | Status: DC | PRN
  Administered 2012-04-22: 100 ug via INTRAVENOUS

## 2012-04-22 MED ORDER — HYDROMORPHONE 0.3 MG/ML IV SOLN
INTRAVENOUS | Status: AC
  Filled 2012-04-22: qty 25

## 2012-04-22 MED ORDER — DIPHENHYDRAMINE HCL 12.5 MG/5ML PO ELIX: 12.5000 mg | ORAL_SOLUTION | ORAL | Status: DC | PRN

## 2012-04-22 MED ORDER — DIPHENHYDRAMINE HCL 50 MG/ML IJ SOLN
12.5000 mg | Freq: Four times a day (QID) | INTRAMUSCULAR | Status: DC | PRN
Start: 2012-04-22 — End: 2012-04-24

## 2012-04-22 MED ORDER — KETAMINE HCL 10 MG/ML IJ SOLN
INTRAMUSCULAR | Status: DC | PRN
  Administered 2012-04-22 (×4): 10 mg via INTRAVENOUS

## 2012-04-22 MED ORDER — ONDANSETRON HCL 4 MG PO TABS: 4.0000 mg | ORAL_TABLET | Freq: Four times a day (QID) | ORAL | Status: DC | PRN

## 2012-04-22 MED ORDER — BUPIVACAINE ON-Q PAIN PUMP (FOR ORDER SET NO CHG)
INJECTION | Status: DC
  Filled 2012-04-22: qty 1

## 2012-04-22 MED ORDER — ACETAMINOPHEN 10 MG/ML IV SOLN: INTRAVENOUS | Status: DC | PRN

## 2012-04-22 MED ORDER — LACTATED RINGERS IV SOLN
INTRAVENOUS | Status: DC | PRN
  Administered 2012-04-22 (×3): via INTRAVENOUS

## 2012-04-22 MED ORDER — OXYCODONE HCL 5 MG PO TABS
5.0000 mg | ORAL_TABLET | ORAL | Status: DC | PRN
  Administered 2012-04-22 – 2012-04-24 (×9): 10 mg via ORAL
  Filled 2012-04-22 (×10): qty 2

## 2012-04-22 MED ORDER — PROMETHAZINE HCL 25 MG/ML IJ SOLN: 6.2500 mg | INTRAMUSCULAR | Status: DC | PRN

## 2012-04-22 MED ORDER — DIPHENHYDRAMINE HCL 12.5 MG/5ML PO ELIX
12.5000 mg | ORAL_SOLUTION | Freq: Four times a day (QID) | ORAL | Status: DC | PRN
  Filled 2012-04-22: qty 5

## 2012-04-22 MED ORDER — SODIUM CHLORIDE 0.9 % IJ SOLN: 9.0000 mL | INTRAMUSCULAR | Status: DC | PRN

## 2012-04-22 MED ORDER — RIVAROXABAN 10 MG PO TABS
10.0000 mg | ORAL_TABLET | Freq: Every day | ORAL | Status: DC
  Administered 2012-04-23 – 2012-04-24 (×2): 10 mg via ORAL
  Filled 2012-04-22 (×3): qty 1

## 2012-04-22 MED ORDER — ACETAMINOPHEN 10 MG/ML IV SOLN
1000.0000 mg | Freq: Four times a day (QID) | INTRAVENOUS | Status: AC
  Administered 2012-04-22 – 2012-04-23 (×4): 1000 mg via INTRAVENOUS
  Filled 2012-04-22 (×6): qty 100

## 2012-04-22 MED ORDER — SODIUM CHLORIDE 0.9 % IV SOLN
INTRAVENOUS | Status: DC
  Administered 2012-04-22: 100 mL/h via INTRAVENOUS
  Administered 2012-04-23: 02:00:00 via INTRAVENOUS
  Administered 2012-04-23: 20 mL/h via INTRAVENOUS

## 2012-04-22 MED ORDER — METHOCARBAMOL 100 MG/ML IJ SOLN
500.0000 mg | Freq: Four times a day (QID) | INTRAVENOUS | Status: DC | PRN
  Administered 2012-04-22: 500 mg via INTRAVENOUS
  Filled 2012-04-22: qty 5

## 2012-04-22 MED ORDER — METHOCARBAMOL 500 MG PO TABS
500.0000 mg | ORAL_TABLET | Freq: Four times a day (QID) | ORAL | Status: DC | PRN
  Administered 2012-04-23 – 2012-04-24 (×3): 500 mg via ORAL
  Filled 2012-04-22 (×3): qty 1

## 2012-04-22 MED ORDER — NEBIVOLOL HCL 5 MG PO TABS
5.0000 mg | ORAL_TABLET | Freq: Every morning | ORAL | Status: DC
  Administered 2012-04-23 – 2012-04-24 (×2): 5 mg via ORAL
  Filled 2012-04-22 (×3): qty 1

## 2012-04-22 MED ORDER — INSULIN ASPART 100 UNIT/ML ~~LOC~~ SOLN
0.0000 [IU] | Freq: Three times a day (TID) | SUBCUTANEOUS | Status: DC
  Administered 2012-04-22: 5 [IU] via SUBCUTANEOUS
  Administered 2012-04-23: 2 [IU] via SUBCUTANEOUS
  Administered 2012-04-23: 8 [IU] via SUBCUTANEOUS
  Administered 2012-04-23 – 2012-04-24 (×2): 2 [IU] via SUBCUTANEOUS

## 2012-04-22 MED ORDER — 0.9 % SODIUM CHLORIDE (POUR BTL) OPTIME
TOPICAL | Status: DC | PRN
  Administered 2012-04-22: 1000 mL

## 2012-04-22 MED ORDER — CEFAZOLIN SODIUM-DEXTROSE 2-3 GM-% IV SOLR
INTRAVENOUS | Status: AC
  Filled 2012-04-22: qty 50

## 2012-04-22 MED ORDER — NALOXONE HCL 0.4 MG/ML IJ SOLN: 0.4000 mg | INTRAMUSCULAR | Status: DC | PRN

## 2012-04-22 MED ORDER — ACETAMINOPHEN 325 MG PO TABS: 650.0000 mg | ORAL_TABLET | Freq: Four times a day (QID) | ORAL | Status: DC | PRN

## 2012-04-22 SURGICAL SUPPLY — 51 items
BAG ZIPLOCK 12X15 (MISCELLANEOUS) ×2 IMPLANT
BANDAGE ELASTIC 6 VELCRO ST LF (GAUZE/BANDAGES/DRESSINGS) ×2 IMPLANT
BANDAGE ESMARK 6X9 LF (GAUZE/BANDAGES/DRESSINGS) ×1 IMPLANT
BLADE SAG 18X100X1.27 (BLADE) ×2 IMPLANT
BLADE SAW SGTL 11.0X1.19X90.0M (BLADE) ×2 IMPLANT
BNDG ESMARK 6X9 LF (GAUZE/BANDAGES/DRESSINGS) ×2
BOWL SMART MIX CTS (DISPOSABLE) ×2 IMPLANT
CATH KIT ON-Q SILVERSOAK 5IN (CATHETERS) ×2 IMPLANT
CEMENT HV SMART SET (Cement) ×4 IMPLANT
CLOTH BEACON ORANGE TIMEOUT ST (SAFETY) ×2 IMPLANT
CUFF TOURN SGL QUICK 34 (TOURNIQUET CUFF) ×1
CUFF TRNQT CYL 34X4X40X1 (TOURNIQUET CUFF) ×1 IMPLANT
DRAPE EXTREMITY T 121X128X90 (DRAPE) ×2 IMPLANT
DRAPE POUCH INSTRU U-SHP 10X18 (DRAPES) ×2 IMPLANT
DRAPE U-SHAPE 47X51 STRL (DRAPES) ×2 IMPLANT
DRSG ADAPTIC 3X8 NADH LF (GAUZE/BANDAGES/DRESSINGS) ×2 IMPLANT
DRSG PAD ABDOMINAL 8X10 ST (GAUZE/BANDAGES/DRESSINGS) ×2 IMPLANT
DURAPREP 26ML APPLICATOR (WOUND CARE) ×2 IMPLANT
ELECT REM PT RETURN 9FT ADLT (ELECTROSURGICAL) ×2
ELECTRODE REM PT RTRN 9FT ADLT (ELECTROSURGICAL) ×1 IMPLANT
EVACUATOR 1/8 PVC DRAIN (DRAIN) ×2 IMPLANT
FACESHIELD LNG OPTICON STERILE (SAFETY) ×10 IMPLANT
GLOVE BIO SURGEON STRL SZ7.5 (GLOVE) ×2 IMPLANT
GLOVE BIO SURGEON STRL SZ8 (GLOVE) ×2 IMPLANT
GLOVE BIOGEL PI IND STRL 8 (GLOVE) ×2 IMPLANT
GLOVE BIOGEL PI INDICATOR 8 (GLOVE) ×2
GOWN STRL NON-REIN LRG LVL3 (GOWN DISPOSABLE) ×2 IMPLANT
GOWN STRL REIN XL XLG (GOWN DISPOSABLE) ×2 IMPLANT
HANDPIECE INTERPULSE COAX TIP (DISPOSABLE) ×1
IMMOBILIZER KNEE 20 (SOFTGOODS) ×2
IMMOBILIZER KNEE 20 THIGH 36 (SOFTGOODS) ×1 IMPLANT
KIT BASIN OR (CUSTOM PROCEDURE TRAY) ×2 IMPLANT
MANIFOLD NEPTUNE II (INSTRUMENTS) ×2 IMPLANT
NS IRRIG 1000ML POUR BTL (IV SOLUTION) ×2 IMPLANT
PACK TOTAL JOINT (CUSTOM PROCEDURE TRAY) ×2 IMPLANT
PAD ABD 7.5X8 STRL (GAUZE/BANDAGES/DRESSINGS) ×2 IMPLANT
PADDING CAST COTTON 6X4 STRL (CAST SUPPLIES) ×2 IMPLANT
POSITIONER SURGICAL ARM (MISCELLANEOUS) ×2 IMPLANT
SET HNDPC FAN SPRY TIP SCT (DISPOSABLE) ×1 IMPLANT
SPONGE GAUZE 4X4 12PLY (GAUZE/BANDAGES/DRESSINGS) ×2 IMPLANT
STRIP CLOSURE SKIN 1/2X4 (GAUZE/BANDAGES/DRESSINGS) ×2 IMPLANT
SUCTION FRAZIER 12FR DISP (SUCTIONS) ×2 IMPLANT
SUT MNCRL AB 4-0 PS2 18 (SUTURE) ×2 IMPLANT
SUT PDS AB 1 CT1 27 (SUTURE) ×6 IMPLANT
SUT VIC AB 2-0 CT1 27 (SUTURE) ×3
SUT VIC AB 2-0 CT1 TAPERPNT 27 (SUTURE) ×3 IMPLANT
SUT VLOC 180 0 24IN GS25 (SUTURE) ×2 IMPLANT
TOWEL OR 17X26 10 PK STRL BLUE (TOWEL DISPOSABLE) ×4 IMPLANT
TRAY FOLEY CATH 14FRSI W/METER (CATHETERS) ×2 IMPLANT
WATER STERILE IRR 1500ML POUR (IV SOLUTION) ×2 IMPLANT
WRAP KNEE MAXI GEL POST OP (GAUZE/BANDAGES/DRESSINGS) ×2 IMPLANT

## 2012-04-22 NOTE — Transfer of Care (Signed)
Immediate Anesthesia Transfer of Care Note  Patient: Nathan Navarro  Procedure(s) Performed: Procedure(s) (LRB): TOTAL KNEE ARTHROPLASTY (Left)  Patient Location: PACU  Anesthesia Type: Spinal  Level of Consciousness: awake, alert , oriented and patient cooperative  Airway & Oxygen Therapy: Patient Spontanous Breathing and Patient connected to nasal cannula oxygen  Post-op Assessment: Report given to PACU RN, Post -op Vital signs reviewed and stable and able to wiggle toes bilat but denies pain.  Post vital signs: Reviewed and stable  Complications: No apparent anesthesia complications

## 2012-04-22 NOTE — Progress Notes (Signed)
CARE MANAGEMENT NOTE 04/22/2012  Patient:  Nathan Navarro, Nathan Navarro   Account Number:  [id number]  Date Initiated:  04/22/2012  Documentation initiated by:  Colleen Can  Subjective/Objective Assessment:   dx left total knee arthroplasty     Action/Plan:   CM spoke with patient andspouse. Plans are for patient to return to his home in [place] where spouse will be caregiver.   Anticipated DC Date:  04/24/2012   Anticipated DC Plan:  HOME W HOME HEALTH SERVICES  In-house referral  NA      DC Planning Services  CM consult      [facility]  HOME HEALTH   Choice offered to / List presented to:  C-1 Patient   DME arranged  NA      DME agency  NA     HH arranged  HH-2 PT      [facility] agency  [facility]   Status of service:  In process, will continue to follow Medicare Important Message given?   (If response is "NO", the following Medicare IM given date fields will be blank)   Per UR Regulation:  Reviewed for med. necessity/level of care/duration of stay  Comments:  04/22/2012 Raynelle Bring BSN CCM [phone] Pt already has RW and access to commode seat. He wants to use [place] for [facility] services. List of home health agencies placed on shadow chart. CM will follow

## 2012-04-22 NOTE — Anesthesia Postprocedure Evaluation (Signed)
  Anesthesia Post-op Note  Patient: Nathan Navarro  Procedure(s) Performed: Procedure(s) (LRB): TOTAL KNEE ARTHROPLASTY (Left)  Patient Location: PACU  Anesthesia Type: Spinal  Level of Consciousness: awake and alert   Airway and Oxygen Therapy: Patient Spontanous Breathing  Post-op Pain: mild  Post-op Assessment: Post-op Vital signs reviewed, Patient's Cardiovascular Status Stable, Respiratory Function Stable, Patent Airway and No signs of Nausea or vomiting  Post-op Vital Signs: stable  Complications: No apparent anesthesia complications

## 2012-04-22 NOTE — Interval H&P Note (Signed)
History and Physical Interval Note:  04/22/2012 9:24 AM  Nathan Navarro  has presented today for surgery, with the diagnosis of Osteoarthritis of the Left knee  The various methods of treatment have been discussed with the patient and family. After consideration of risks, benefits and other options for treatment, the patient has consented to  Procedure(s) (LRB): TOTAL KNEE ARTHROPLASTY (Left) as a surgical intervention .  The patients' history has been reviewed, patient examined, no change in status, stable for surgery.  I have reviewed the patients' chart and labs.  Questions were answered to the patient's satisfaction.     Loanne Drilling

## 2012-04-22 NOTE — Preoperative (Signed)
Beta Blockers   Reason not to administer Beta Blockers:Took Bystolic 5 mg po this am.

## 2012-04-22 NOTE — Anesthesia Preprocedure Evaluation (Addendum)
Anesthesia Evaluation  Patient identified by MRN, date of birth, ID band Patient awake    Reviewed: Allergy & Precautions, H&P , NPO status , Patient's Chart, lab work & pertinent test results  Airway Mallampati: II TM Distance: >3 FB Neck ROM: Full    Dental No notable dental hx.    Pulmonary neg pulmonary ROS,  breath sounds clear to auscultation  Pulmonary exam normal       Cardiovascular + CAD, + CABG and +CHF Rhythm:Regular Rate:Normal     Neuro/Psych negative neurological ROS  negative psych ROS   GI/Hepatic negative GI ROS, Neg liver ROS,   Endo/Other  Hypothyroidism   Renal/GU negative Renal ROS  negative genitourinary   Musculoskeletal negative musculoskeletal ROS (+)   Abdominal   Peds negative pediatric ROS (+)  Hematology negative hematology ROS (+)   Anesthesia Other Findings   Reproductive/Obstetrics negative OB ROS                          Anesthesia Physical Anesthesia Plan  ASA: III  Anesthesia Plan: Spinal   Post-op Pain Management:    Induction: Intravenous  Airway Management Planned:   Additional Equipment:   Intra-op Plan:   Post-operative Plan:   Informed Consent: I have reviewed the patients History and Physical, chart, labs and discussed the procedure including the risks, benefits and alternatives for the proposed anesthesia with the patient or authorized representative who has indicated his/her understanding and acceptance.   Dental advisory given  Plan Discussed with: CRNA  Anesthesia Plan Comments:         Anesthesia Quick Evaluation

## 2012-04-22 NOTE — Op Note (Signed)
Pre-operative diagnosis- Osteoarthritis  Left knee(s)  Post-operative diagnosis- Osteoarthritis Left knee(s)  Procedure-  Left  Total Knee Arthroplasty  Surgeon- Gus Rankin. Maryem Shuffler, MD  Assistant- Dimitri Ped, PA-C   Anesthesia-  Spinal EBL-* No blood loss amount entered *  Drains Hemovac  Tourniquet time-  Total Tourniquet Time Documented: Thigh (Left) - 42 minutes   Complications- None  Condition-PACU - hemodynamically stable.   Brief Clinical Note   Nathan Navarro is a 71 y.o. year old male with end stage OA of his left knee with progressively worsening pain and dysfunction. He has constant pain, with activity and at rest and significant functional deficits with difficulties even with ADLs. He has had extensive non-op management including analgesics, injections of cortisone and viscosupplements, and home exercise program, but remains in significant pain with significant dysfunction. Radiographs show bone on bone arthritis medial and patellofemoral. He presents now for left Total Knee Arthroplasty.    Procedure in detail---   The patient is brought into the operating room and positioned supine on the operating table. After successful administration of  Spinal,   a tourniquet is placed high on the  Left thigh(s) and the lower extremity is prepped and draped in the usual sterile fashion. Time out is performed by the operating team and then the  Left lower extremity is wrapped in Esmarch, knee flexed and the tourniquet inflated to 300 mmHg.       A midline incision is made with a ten blade through the subcutaneous tissue to the level of the extensor mechanism. A fresh blade is used to make a medial parapatellar arthrotomy. Soft tissue over the proximal medial tibia is subperiosteally elevated to the joint line with a knife and into the semimembranosus bursa with a Cobb elevator. Soft tissue over the proximal lateral tibia is elevated with attention being paid to avoiding the patellar  tendon on the tibial tubercle. The patella is everted, knee flexed 90 degrees and the ACL and PCL are removed. Findings are bone on bone medial and patellofemoral with large medial osteophytes.        The drill is used to create a starting hole in the distal femur and the canal is thoroughly irrigated with sterile saline to remove the fatty contents. The 5 degree Left  valgus alignment guide is placed into the femoral canal and the distal femoral cutting block is pinned to remove 11 mm off the distal femur. Resection is made with an oscillating saw.      The tibia is subluxed forward and the menisci are removed. The extramedullary alignment guide is placed referencing proximally at the medial aspect of the tibial tubercle and distally along the second metatarsal axis and tibial crest. The block is pinned to remove 2mm off the more deficient medial  side. Resection is made with an oscillating saw. Size 4is the most appropriate size for the tibia and the proximal tibia is prepared with the modular drill and keel punch for that size.      The femoral sizing guide is placed and size 4 is most appropriate. Rotation is marked off the epicondylar axis and confirmed by creating a rectangular flexion gap at 90 degrees. The size 4 cutting block is pinned in this rotation and the anterior, posterior and chamfer cuts are made with the oscillating saw. The intercondylar block is then placed and that cut is made.      Trial size 4 tibial component, trial size 4 posterior stabilized femur and a 15  mm posterior stabilized rotating platform insert trial is placed. Full extension is achieved with excellent varus/valgus and anterior/posterior balance throughout full range of motion. The patella is everted and thickness measured to be 27  mm. Free hand resection is taken to 15 mm, a 41 template is placed, lug holes are drilled, trial patella is placed, and it tracks normally. Osteophytes are removed off the posterior femur with  the trial in place. All trials are removed and the cut bone surfaces prepared with pulsatile lavage. Cement is mixed and once ready for implantation, the size 4 tibial implant, size  4 posterior stabilized femoral component, and the size 41 patella are cemented in place and the patella is held with the clamp. The trial insert is placed and the knee held in full extension. All extruded cement is removed and once the cement is hard the permanent 15 mm posterior stabilized rotating platform insert is placed into the tibial tray.      The wound is copiously irrigated with saline solution and the extensor mechanism closed over a hemovac drain with #1 PDS suture. The tourniquet is released for a total tourniquet time of 42  minutes. Flexion against gravity is 140 degrees and the patella tracks normally. Subcutaneous tissue is closed with 2.0 vicryl and subcuticular with running 4.0 Monocryl. The catheter for the Marcaine pain pump is placed and the pump is initiated. The incision is cleaned and dried and steri-strips and a bulky sterile dressing are applied. The limb is placed into a knee immobilizer and the patient is awakened and transported to recovery in stable condition.      Please note that a surgical assistant was a medical necessity for this procedure in order to perform it in a safe and expeditious manner. Surgical assistant was necessary to retract the ligaments and vital neurovascular structures to prevent injury to them and also necessary for proper positioning of the limb to allow for anatomic placement of the prosthesis.   Gus Rankin Berlie Persky, MD    04/22/2012, 10:56 AM

## 2012-04-22 NOTE — Anesthesia Procedure Notes (Signed)
Spinal  Patient location during procedure: OR Start time: 04/22/2012 9:51 AM Staffing Performed by: anesthesiologist  Preanesthetic Checklist Completed: patient identified, site marked, surgical consent, pre-op evaluation, timeout performed, IV checked, risks and benefits discussed and monitors and equipment checked Spinal Block Patient position: sitting Prep: Betadine Patient monitoring: heart rate, continuous pulse ox and blood pressure Injection technique: single-shot Needle Needle type: Spinocan  Needle gauge: 22 G Needle length: 9 cm Additional Notes Expiration date of kit checked and confirmed. Patient tolerated procedure well, without complications.

## 2012-04-22 NOTE — H&P (View-Only) (Signed)
Nathan Navarro  DOB: [birth year] Married / Language: English / Race: White / Male  Date of Admission:  04/22/2012  Chief Complaint:  Left Knee Pain  History of Present Illness The patient is a 71 year old male who comes infor a preoperative History and Physical. The patient is scheduled for a left total knee arthroplasty to be performed by Dr. Gus Rankin. Aluisio, MD at [facility] on 04/22/2012. The patient is a 71 year old male who presents with knee complaints. The patient was seen today for a second opinion. The patient reports left knee and right knee symptoms including: pain which began 5 year(s) ago without any known injury. Note for "Knee pain": His pain has worsened over time. He has been told he had to have a total knee replacement, and wanted to come here to have the surgery done by someone he trusts. He states that he has had bilateral knee pain for several years but in the last five years it has been progressively worse. In the last year he has begun to have pain at rest, in addition to pain walking up and down stairs and standing from seated position. He denies locking or catching symptoms. At the current time, he reports that the left knee is causing him more pain. He has been to Dr. Farris Has at [facility], where he received cortisone injections and viscosupplementation. He had no relief with the gel shots but was pain free for 2-3 months with the cortisone injections. He presents today ready for total knee replacements, the left knee first. They have been treated conservatively in the past for the above stated problem and despite conservative measures, they continue to have progressive pain and severe functional limitations and dysfunction. They have failed non-operative management. It is felt that they would benefit from undergoing total joint replacement. Risks and benefits of the procedure have been discussed with the patient and they elect to proceed with surgery. There are  no active contraindications to surgery such as ongoing infection or rapidly progressive neurological disease.   Medication History MetFORMIN HCl (1000MG  Tablet, Oral) Active. Crestor (40MG  Tablet, Oral) Active. Synthroid ( Tablet, Oral) Active. Bystolic (5MG  Tablet, Oral) Active. Aspirin EC (81MG  Tablet DR, Oral) Active. Fish Oil Burp-Less (1000MG  Capsule, Oral) Active. Multivitamins ( Oral) Active. Lantus SoloStar (100UNIT/ML Solution, Subcutaneous) Active. HumaLOG (100UNIT/ML Solution, Subcutaneous) Active.  Problem List/Past Medical Impaired Hearing. uses hearing bilateral hearing aids Coronary artery disease Diabetes Mellitus, Type II  Past Surgical History Arthroscopy of Knee - right - 2009 Coronary Artery Bypass Graft. 4 or more vessels, 1981 (3), 2004 (4) Spinal Surgery - 1986  Family History Congestive Heart Failure. mother Diabetes Mellitus. mother and sister Heart Disease. mother, father, sister and brother Heart disease in male family member before age 29 Hypertension. mother Osteoarthritis. mother  Social History Alcohol use. current drinker; drinks beer, wine and hard liquor; only occasionally per week Children. 2 Current work status. retired Financial planner (Currently). no Drug/Alcohol Rehab (Previously). no Exercise. Exercises daily; does running / walking, individual sport and gym / weights Illicit drug use. no Living situation. live with spouse Marital status. married Number of flights of stairs before winded. greater than 5 Pain Contract. no Tobacco / smoke exposure. no Tobacco use. Never smoker. never smoker  Review of Systems General:Present- Chills, Fever, Night Sweats, Fatigue, Feeling sick and Weight Loss. Not Present- Appetite Loss and Weight Gain. Skin:Not Present- Itching, Rash, Skin Color Changes, Ulcer, Psoriasis and Change in Hair or Nails. HEENT:Not  Present- Sensitivity to light, Hearing problems,  Nose Bleed and Ringing in the Ears. Neck:Not Present- Swollen Glands and Neck Mass. Respiratory:Present- Chronic Cough. Not Present- Snoring, Bloody sputum and Dyspnea. Cardiovascular:Not Present- Shortness of Breath, Chest Pain, Swelling of Extremities, Leg Cramps and Palpitations. Gastrointestinal:Not Present- Bloody Stool, Heartburn, Abdominal Pain, Vomiting, Nausea and Incontinence of Stool. Male Genitourinary:Not Present- Blood in Urine, Frequency, Incontinence and Nocturia. Musculoskeletal:Present- Joint Stiffness, Joint Swelling and Joint Pain. Not Present- Muscle Weakness, Muscle Pain and Back Pain. Neurological:Not Present- Tingling, Numbness, Burning, Tremor, Headaches and Dizziness. Psychiatric:Not Present- Anxiety, Depression and Memory Loss. Endocrine:Not Present- Cold Intolerance, Heat Intolerance, Excessive hunger and Excessive Thirst. Hematology:Not Present- Abnormal Bleeding, Anemia, Blood Clots and Easy Bruising.  Vitals Weight: 250 lb Height: 74 in Body Surface Area: 2.43 m Body Mass Index: 32.1 kg/m Pulse: 68 (Regular) Resp.: 14 (Unlabored) BP: 124/80 (Sitting, Right Arm, Standard)  Physical Exam The physical exam findings are as follows:   General Mental Status - Alert, cooperative and good historian. General Appearance- pleasant. Not in acute distress. Orientation- Oriented X3. Build & Nutrition- Well nourished and Well developed.   Head and Neck Head- normocephalic, atraumatic . Neck Global Assessment- supple. no bruit auscultated on the right and no bruit auscultated on the left.   Eye Pupil- Bilateral- Regular and Round. Motion- Bilateral- EOMI.   Chest and Lung Exam Auscultation: Breath sounds:- clear at anterior chest wall and - clear at posterior chest wall. Adventitious sounds:- No Adventitious sounds.   Cardiovascular Auscultation:Rhythm- Regular rate and rhythm. Heart Sounds- S1 WNL and S2  WNL. Murmurs & Other Heart Sounds:Auscultation of the heart reveals - No Murmurs.   Abdomen Palpation/Percussion:Tenderness- Abdomen is non-tender to palpation. Rigidity (guarding)- Abdomen is soft. Auscultation:Auscultation of the abdomen reveals - Bowel sounds normal.   Male Genitourinary Not done, not pertinent to present illness  Musculoskeletal On exam a well developed male, alert and oriented in no apparent distress. Both hips show normal range of motion with no discomfort. The left knee shows about a 5 degree flexion contracture with flexion to 125. There was moderate crepitus on range of motion. He has slight tenderness medial. There is no lateral tenderness. There is no instability noted. Right knee 0 to 125. No swelling. Slight tenderness medial greater than lateral. No instability noted. There is moderate crepitus on range of motion. Pulse sensation motor intact both lower extremities. He has slow gait, but it is a steady gait pattern.  RADIOGRAPHS: AP both knees and lateral show advanced endstage arthritis medial and patellofemoral compartments of both knees with bone on bone and medial and patellofemoral both. He also has slight varus and has large osteophytes.  Assessment & Plan Osteoarthritis Left Knee  Patient is for a Left Total Knee Replacement by Dr. Lequita Halt.  Plan is to go home after the hospital stay.  Difficulty hearing - uses hearing aids, may not have in after surgery. Please place sign in room and/or on door of hospital room.  PCP- Dr. Wynn Banker - The patient has been seen preoperatively and felt to be stable for surgery. Cards - Dr. Royann Shivers [facility] - The patient has been seen preoperatively and it is felt to be low risk of CV complications.  Avel Peace, PA-C

## 2012-04-23 ENCOUNTER — Encounter (HOSPITAL_COMMUNITY): Payer: Self-pay | Admitting: Orthopedic Surgery

## 2012-04-23 LAB — BASIC METABOLIC PANEL
Chloride: 99 mEq/L (ref 96–112)
GFR calc non Af Amer: 85 mL/min — ABNORMAL LOW (ref >90)
Glucose, Bld: 250 mg/dL — ABNORMAL HIGH (ref 70–99)
Potassium: 4.8 mEq/L (ref 3.5–5.1)
Sodium: 135 mEq/L (ref 135–145)

## 2012-04-23 LAB — GLUCOSE, CAPILLARY
Glucose-Capillary: 253 mg/dL — ABNORMAL HIGH (ref 70–99)
Glucose-Capillary: 181 mg/dL — ABNORMAL HIGH (ref 70–99)

## 2012-04-23 LAB — CBC
HCT: 37.2 % — ABNORMAL LOW (ref 39.0–52.0)
Hemoglobin: 12.5 g/dL — ABNORMAL LOW (ref 13.0–17.0)
MCHC: 33.6 g/dL (ref 30.0–36.0)
RBC: 4.04 MIL/uL — ABNORMAL LOW (ref 4.22–5.81)
WBC: 11.8 10*3/uL — ABNORMAL HIGH (ref 4.0–10.5)

## 2012-04-23 MED ORDER — HYDROMORPHONE HCL PF 1 MG/ML IJ SOLN: 0.5000 mg | INTRAMUSCULAR | Status: DC | PRN

## 2012-04-23 NOTE — Progress Notes (Signed)
Subjective: 1 Day Post-Op Procedure(s) (LRB): TOTAL KNEE ARTHROPLASTY (Left) Patient reports pain as mild.   Patient seen in rounds with Dr. Lequita Halt. Patient is well, and has had no acute complaints or problems We will start therapy today.  Plan is to go Home after hospital stay.  Objective: Vital signs in last 24 hours: Temp:  [97.1 F (36.2 C)-99 F (37.2 C)] 97.6 F (36.4 C) (04/30 0600) Pulse Rate:  [54-75] 67  (04/30 0600) Resp:  [9-22] 15  (04/30 0600) BP: (119-163)/(66-89) 138/79 mmHg (04/30 0600) SpO2:  [92 %-100 %] 99 % (04/30 0600) Weight:  [113.399 kg (250 lb)] 113.399 kg (250 lb) (04/29 1225)  Intake/Output from previous day:  Intake/Output Summary (Last 24 hours) at 04/23/12 0829 Last data filed at 04/23/12 0754  Gross per 24 hour  Intake 5704.86 ml  Output   4775 ml  Net 929.86 ml    Intake/Output this shift: Total I/O In: 360 [P.O.:360] Out: -   Labs:  Basename 04/23/12 0417  HGB 12.5*    Basename 04/23/12 0417  WBC 11.8*  RBC 4.04*  HCT 37.2*  PLT 170    Basename 04/23/12 0417  NA 135  K 4.8  CL 99  CO2 28  BUN 15  CREATININE 0.90  GLUCOSE 250*  CALCIUM 8.5   No results found for this basename: LABPT:2,INR:2 in the last 72 hours  EXAM General - Patient is Alert, Appropriate and Oriented Extremity - Neurovascular intact Sensation intact distally Dressing - dressing C/D/I Motor Function - intact, moving foot and toes well on exam.  Hemovac pulled without difficulty.  Past Medical History  Diagnosis Date  . Type 2 diabetes mellitus   . Unspecified hypothyroidism   . Other and unspecified hyperlipidemia   . Coronary artery disease   . BPH (benign prostatic hypertrophy)   . Sleep apnea 10 yrs ago    mild sleep apnea no cpap needed    Assessment/Plan: 1 Day Post-Op Procedure(s) (LRB): TOTAL KNEE ARTHROPLASTY (Left) Principal Problem:  *OA (osteoarthritis) of knee   Advance diet Up with therapy Continue foley due to  strict I&O and urinary output monitoring Discharge home with home health  DVT Prophylaxis - Xarelto Weight-Bearing as tolerated to left leg Keep foley until tomorrow. No vaccines. D/C PCA Dilaudid, Change to IV push D/C O2 and Pulse OX and try on Room Air  Nathan Navarro 04/23/2012, 8:29 AM

## 2012-04-23 NOTE — Progress Notes (Addendum)
Physical Therapy Treatment Patient Details Name: Nathan Navarro MRN: [id number] DOB: 07-09-1941 Today's Date: 04/23/2012 Time: 7829-5621 PT Time Calculation (min): 21 min  PT Assessment / Plan / Recommendation Comments on Treatment Session  Pt performed exercises reclined in recliner and declined ambulation due to increased pain in L knee this afternoon.    Follow Up Recommendations  Home health PT    Equipment Recommendations  None recommended by PT    Frequency 7X/week   Plan Discharge plan remains appropriate;Frequency remains appropriate    Precautions / Restrictions Precautions Precautions: Knee Required Braces or Orthoses: Knee Immobilizer - Left Knee Immobilizer - Left: Discontinue once straight leg raise with < 10 degree lag Restrictions LLE Weight Bearing: Weight bearing as tolerated   Pertinent Vitals/Pain     Mobility      Exercises Total Joint Exercises Ankle Circles/Pumps: AROM;Both;20 reps;Supine Quad Sets: AROM;Both;20 reps;Supine Gluteal Sets: Both;Other reps (comment);Supine (20 reps) Short Arc Quad: AAROM;Other reps (comment);Supine;Left;Strengthening Heel Slides: AAROM;Strengthening;15 reps;Supine;Left Hip ABduction/ADduction: AAROM;Strengthening;Left;15 reps;Supine Straight Leg Raises: AAROM;Strengthening;Left;10 reps;Supine Knee Flexion: AAROM;Left;Limitations (L knee AAROM -2-40*)   PT Goals Acute Rehab PT Goals Pt will Perform Home Exercise Program: with supervision, verbal cues required/provided PT Goal: Perform Home Exercise Program - Progress: Progressing toward goal  Visit Information  Last PT Received On: 04/23/12 Assistance Needed: +1    Subjective Data  Subjective: "will you tell me what to expect from HHPT?"  (explained HHPT role)   Cognition  Overall Cognitive Status: Appears within functional limits for tasks assessed/performed Arousal/Alertness: Awake/alert Orientation Level: Appears intact for tasks assessed Behavior During  Session: [facility] for tasks performed    Balance     End of Session PT - End of Session Equipment Utilized During Treatment: Gait belt;Left knee immobilizer Activity Tolerance: Patient limited by pain Patient left: in chair;with call bell/phone within reach    [facility] E 04/23/2012, 3:16 PM Pager: [phone]

## 2012-04-23 NOTE — Evaluation (Signed)
Physical Therapy Evaluation Patient Details Name: Nathan Navarro MRN: [id number] DOB: 16-Dec-1941 Today's Date: 04/23/2012 Time: 6295-2841 PT Time Calculation (min): 23 min  PT Assessment / Plan / Recommendation Clinical Impression  Pt s/p L TKA.  Pt would benefit from acute PT services in order to increase independence with ambulation and stairs and improve strength and ROM of L LE to prepare for d/c home with spouse.    PT Assessment  Patient needs continued PT services    Follow Up Recommendations  Home health PT    Equipment Recommendations  None recommended by PT    Frequency 7X/week    Precautions / Restrictions Precautions Precautions: Knee Required Braces or Orthoses: Knee Immobilizer - Left Knee Immobilizer - Left: Discontinue once straight leg raise with < 10 degree lag Restrictions Weight Bearing Restrictions: No LLE Weight Bearing: Weight bearing as tolerated   Pertinent Vitals/Pain       Mobility  Bed Mobility Bed Mobility: Supine to Sit Supine to Sit: 4: Min assist Details for Bed Mobility Assistance: assist for L LE Transfers Transfers: Sit to Stand;Stand to Sit Sit to Stand: 4: Min guard;With upper extremity assist;From bed Stand to Sit: To chair/3-in-1;4: Min guard;With upper extremity assist Details for Transfer Assistance: verbal cues for L LE forward and hand placement Ambulation/Gait Ambulation/Gait Assistance: 4: Min guard Ambulation Distance (Feet): 160 Feet Assistive device: Rolling walker Ambulation/Gait Assistance Details: verbal cues for sequence, step length and safe RW distance Gait Pattern: Step-to pattern    Exercises     PT Goals Acute Rehab PT Goals PT Goal Formulation: With patient Time For Goal Achievement: 04/30/12 Potential to Achieve Goals: Good Pt will go Supine/Side to Sit: with modified independence PT Goal: Supine/Side to Sit - Progress: Goal set today Pt will go Sit to Supine/Side: with modified independence PT  Goal: Sit to Supine/Side - Progress: Goal set today Pt will go Sit to Stand: with modified independence PT Goal: Sit to Stand - Progress: Goal set today Pt will go Stand to Sit: with modified independence PT Goal: Stand to Sit - Progress: Goal set today Pt will Ambulate: >150 feet;with modified independence;with least restrictive assistive device PT Goal: Ambulate - Progress: Goal set today Pt will Go Up / Down Stairs: 3-5 stairs;with supervision;with least restrictive assistive device PT Goal: Up/Down Stairs - Progress: Goal set today Pt will Perform Home Exercise Program: with supervision, verbal cues required/provided PT Goal: Perform Home Exercise Program - Progress: Goal set today  Visit Information  Last PT Received On: 04/23/12 Assistance Needed: +1    Subjective Data  Subjective: "I'm ready to get up."   Prior Functioning  Home Living Lives With: Spouse Type of Home: House Home Access: Stairs to enter [employer] of Steps: 2 Entrance Stairs-Rails: None Home Layout: One level Home Adaptive Equipment: Walker - rolling Prior Function Level of Independence: Independent Communication Communication: HOH;Other (comment) (has hearing aids)    Cognition  Overall Cognitive Status: Appears within functional limits for tasks assessed/performed Arousal/Alertness: Awake/alert Orientation Level: Appears intact for tasks assessed Behavior During Session: [facility] for tasks performed    Extremity/Trunk Assessment Right Upper Extremity Assessment RUE ROM/Strength/Tone: [facility] for tasks assessed Left Upper Extremity Assessment LUE ROM/Strength/Tone: WFL for tasks assessed Right Lower Extremity Assessment RLE ROM/Strength/Tone: [facility] for tasks assessed Left Lower Extremity Assessment LLE ROM/Strength/Tone: Deficits LLE ROM/Strength/Tone Deficits: good quad contraction, ROM TBA   Balance    End of Session PT - End of Session Equipment Utilized During Treatment: Gait belt;Left  knee immobilizer Activity Tolerance: Patient tolerated treatment well Patient left: in chair;with call bell/phone within reach CPM Left Knee CPM Left Knee: Off   Shelia Magallon,KATHrine E 04/23/2012, 11:31 AM Pager: [phone]

## 2012-04-24 LAB — CBC
Hemoglobin: 11.9 g/dL — ABNORMAL LOW (ref 13.0–17.0)
MCH: 30.4 pg (ref 26.0–34.0)
MCHC: 33.4 g/dL (ref 30.0–36.0)
MCV: 91 fL (ref 78.0–100.0)
Platelets: 166 10*3/uL (ref 150–400)

## 2012-04-24 LAB — BASIC METABOLIC PANEL
Calcium: 8.6 mg/dL (ref 8.4–10.5)
Creatinine, Ser: 0.91 mg/dL (ref 0.50–1.35)
GFR calc non Af Amer: 84 mL/min — ABNORMAL LOW (ref >90)
Glucose, Bld: 191 mg/dL — ABNORMAL HIGH (ref 70–99)
Sodium: 135 mEq/L (ref 135–145)

## 2012-04-24 LAB — GLUCOSE, CAPILLARY

## 2012-04-24 MED ORDER — RIVAROXABAN 10 MG PO TABS: 10.0000 mg | ORAL_TABLET | Freq: Every day | ORAL | Status: DC

## 2012-04-24 MED ORDER — OXYCODONE HCL 5 MG PO TABS: 5.0000 mg | ORAL_TABLET | ORAL | Status: AC | PRN

## 2012-04-24 MED ORDER — METHOCARBAMOL 500 MG PO TABS: 500.0000 mg | ORAL_TABLET | Freq: Four times a day (QID) | ORAL | Status: AC | PRN

## 2012-04-24 NOTE — Progress Notes (Signed)
Physical Therapy Treatment Note   04/24/12 1500  PT Visit Information  Last PT Received On 04/24/12  Assistance Needed +1  PT Time Calculation  PT Start Time 1450  PT Stop Time 1510  PT Time Calculation (min) 20 min  Subjective Data  Subjective "I'm ready to leave."  (d/c after PT)  Precautions  Precautions Knee  Required Braces or Orthoses Knee Immobilizer - Left  Knee Immobilizer - Left Discontinue once straight leg raise with < 10 degree lag  Restrictions  LLE Weight Bearing WBAT  Cognition  Overall Cognitive Status Appears within functional limits for tasks assessed/performed  Bed Mobility  Bed Mobility Supine to Sit  Supine to Sit 5: Supervision  Details for Bed Mobility Assistance verbal cue to use R LE to assist L LE  Transfers  Transfers Sit to Stand;Stand to Sit  Sit to Stand 5: Supervision;With upper extremity assist;From bed  Stand to Sit 5: Supervision;With upper extremity assist;To bed  Details for Transfer Assistance verbal cues for hand placement  Ambulation/Gait  Ambulation/Gait Assistance 5: Supervision  Ambulation Distance (Feet) 80 Feet  Assistive device Rolling walker  Ambulation/Gait Assistance Details performing ambulation well, occasional verbal cue to roll RW  Gait Pattern Step-to pattern  Stairs Yes  Stairs Assistance 4: Min guard  Stairs Assistance Details (indicate cue type and reason) verbal cues for sequence, pt's spouse present and educated  Stair Management Technique Step to pattern;Forwards;With walker  Number of Stairs 1   PT - End of Session  Equipment Utilized During Treatment Left knee immobilizer  Activity Tolerance Patient tolerated treatment well  Patient left in bed;with call bell/phone within reach;with family/visitor present  PT - Assessment/Plan  Comments on Treatment Session Pt and spouse educated to keep KI on with mobility until HHPT instructs otherwise.  Reviewed safety with transfers and step.  Spouse present for tx.  Pt to  d/c home today.  PT Plan Discharge plan remains appropriate;Frequency remains appropriate  Follow Up Recommendations Home health PT  Equipment Recommended None recommended by PT;None recommended by OT  Acute Rehab PT Goals  PT Goal: Supine/Side to Sit - Progress Progressing toward goal  PT Goal: Sit to Stand - Progress Progressing toward goal  PT Goal: Stand to Sit - Progress Progressing toward goal  PT Goal: Ambulate - Progress Progressing toward goal  PT Goal: Up/Down Stairs - Progress Progressing toward goal    Zenovia Jarred, PT Pager: [phone]

## 2012-04-24 NOTE — Progress Notes (Signed)
Physical Therapy Treatment Patient Details Name: Nathan Navarro MRN: [id number] DOB: 11-27-1941 Today's Date: 04/24/2012 Time: 4098-1191 PT Time Calculation (min): 15 min  PT Assessment / Plan / Recommendation Comments on Treatment Session  Pt reports he is very tired today but agreed to ambulation and performing 1 step.  Possible d/c later today.    Follow Up Recommendations  Home health PT    Equipment Recommendations  None recommended by PT;None recommended by OT    Frequency     Plan Discharge plan remains appropriate;Frequency remains appropriate    Precautions / Restrictions Precautions Precautions: Knee Required Braces or Orthoses: Knee Immobilizer - Left Knee Immobilizer - Left: Discontinue once straight leg raise with < 10 degree lag Restrictions Weight Bearing Restrictions: No LLE Weight Bearing: Weight bearing as tolerated       Mobility  Bed Mobility Bed Mobility: Sit to Supine Supine to Sit: 4: Min assist Sit to Supine: 4: Min assist Details for Bed Mobility Assistance: assist for L LE to the EOB Transfers Transfers: Sit to Stand;Stand to Sit Sit to Stand: 4: Min guard;With upper extremity assist;From chair/3-in-1 Stand to Sit: 4: Min guard;With upper extremity assist;To bed Details for Transfer Assistance: verbal cues for safe technique and backing all the way to bed Ambulation/Gait Ambulation/Gait Assistance: 4: Min guard Ambulation Distance (Feet): 120 Feet Assistive device: Rolling walker Ambulation/Gait Assistance Details: initial verbal cue for sequence Gait Pattern: Step-to pattern Stairs: Yes Stairs Assistance: 4: Min guard Stairs Assistance Details (indicate cue type and reason): verbal cue for safe technique and sequence, pt ascended forward then descended backward since closer to room (but educated pt he would perform forward with surgical leg first upon going down step)  Stair Management Technique: Step to pattern;Forwards;With walker Number  of Stairs: 1     Exercises     PT Goals Acute Rehab PT Goals PT Goal: Sit to Supine/Side - Progress: Progressing toward goal PT Goal: Sit to Stand - Progress: Progressing toward goal PT Goal: Stand to Sit - Progress: Progressing toward goal PT Goal: Ambulate - Progress: Progressing toward goal Pt will Go Up / Down Stairs: 1-2 stairs;with supervision;with least restrictive assistive device (pt now reports 1 step into home) PT Goal: Up/Down Stairs - Progress: Goal set today  Visit Information  Last PT Received On: 04/24/12 Assistance Needed: +1    Subjective Data  Subjective: "I'm so tired today."   Cognition  Overall Cognitive Status: Appears within functional limits for tasks assessed/performed Arousal/Alertness: Awake/alert Orientation Level: Appears intact for tasks assessed Behavior During Session: [facility] for tasks performed Cognition - Other Comments: However, pt does jump ahead in conversation before OT able to finish education on one task.    Balance     End of Session PT - End of Session Equipment Utilized During Treatment: Gait belt;Left knee immobilizer Activity Tolerance: Patient limited by pain Patient left: in bed;with call bell/phone within reach    [facility] E 04/24/2012, 1:01 PM Pager: [phone]

## 2012-04-24 NOTE — Progress Notes (Signed)
Pt D/C'd home after 2 sessions with PT. Will go home with HHPT per Genevieve Norlander.  No IV noted-pulled out by pt. early this am. Dressing CDI to L knee. Dressing supplies provided for home use.  No changws in am assessments today. D/C instructions & RX's given to wif & pt with verbalized understanding. Pt had refused laxative offered this am- on scheduled stool softener. Pt voided about 250cc after F/C d/c'd this am.

## 2012-04-24 NOTE — Discharge Instructions (Signed)
Knee Rehabilitation, Guidelines Following Surgery Results after knee surgery are often greatly improved when you follow the exercise, range of motion and muscle strengthening exercises prescribed by your doctor. Safety measures are also important to protect the knee from further injury. Any time any of these exercises cause you to have increased pain or swelling in your knee joint, decrease the amount until you are comfortable again and slowly increase them. If you have problems or questions, call your caregiver or physical therapist for advice. HOME CARE INSTRUCTIONS   Remove items at home which could result in a fall. This includes throw rugs or furniture in walking pathways.   Continue medications as instructed.   You may shower or take tub baths when your staples or stitches are removed or as instructed.   Walk using crutches or walker as instructed.   Put weight on your legs and walk as much as is comfortable.   You may resume a sexual relationship in one month or when given the OK by your doctor.   Return to work as instructed by your doctor.   Do not drive a car for 6 weeks or as instructed.   Wear elastic stockings until instructed not to.   Make sure you keep all of your appointments after your operation with all of your doctors and caregivers.  RANGE OF MOTION AND STRENGTHENING EXERCISES Rehabilitation of the knee is important following a knee injury or an operation. After just a few days of immobilization, the muscles of the thigh which control the knee become weakened and shrink (atrophy). Knee exercises are designed to build up the tone and strength of the thigh muscles and to improve knee motion. Often times heat used for twenty to thirty minutes before working out will loosen up your tissues and help with improving the range of motion. These exercises can be done on a training (exercise) mat, on the floor, on a table or on a bed. Use what ever works the best and is most  comfortable for you Knee exercises include:  Leg Lifts - While your knee is still immobilized in a splint or cast, you can do straight leg raises. Lift the leg to 60 degrees, hold for 3 sec, and slowly lower the leg. Repeat 10-20 times 2-3 times daily. Perform this exercise against resistance later as your knee gets better.   Quad and Hamstring Sets - Tighten up the muscle on the front of the thigh (Quad) and hold for 5-10 sec. Repeat this 10-20 times hourly. Hamstring sets are done by pushing the foot backward against an object and holding for 5-10 sec. Repeat as with quad sets.  A rehabilitation program following serious knee injuries can speed recovery and prevent re-injury in the future due to weakened muscles. Contact your doctor or a physical therapist for more information on knee rehabilitation. MAKE SURE YOU:   Understand these instructions.   Will watch your condition.   Will get help right away if you are not doing well or get worse.  Document Released: 12/11/2005 Document Revised: 11/30/2011 Document Reviewed: 05/31/2007 [facility] Patient Information 2012 [place], [place].  Pick up stool softner and laxative for home. Do not submerge incision under water. May shower. Continue to use ice for pain and swelling from surgery.  Take Xarelto for two and a half more weeks, then discontinue Xarelto.  After completing the Xarelto, the patient may resume their 81 mg Aspirin

## 2012-04-24 NOTE — Progress Notes (Signed)
Subjective: 2 Days Post-Op Procedure(s) (LRB): TOTAL KNEE ARTHROPLASTY (Left) Patient reports pain as mild.   Patient seen in rounds with Dr. Lequita Halt. Patient is well, and has had no acute complaints or problems  Objective: Vital signs in last 24 hours: Temp:  [98 F (36.7 C)-100 F (37.8 C)] 98.5 F (36.9 C) (05/01 0529) Pulse Rate:  [63-85] 85  (05/01 0529) Resp:  [18] 18  (05/01 0529) BP: (125-153)/(54-82) 125/70 mmHg (05/01 0529) SpO2:  [94 %-99 %] 95 % (05/01 0529)  Intake/Output from previous day:  Intake/Output Summary (Last 24 hours) at 04/24/12 1002 Last data filed at 04/24/12 0900  Gross per 24 hour  Intake 2377.33 ml  Output   2450 ml  Net -72.67 ml    Intake/Output this shift: Total I/O In: 240 [P.O.:240] Out: 750 [Urine:750]  Labs:  [facility] 04/24/12 0410 04/23/12 0417  HGB 11.9* 12.5*    Basename 04/24/12 0410 04/23/12 0417  WBC 9.7 11.8*  RBC 3.91* 4.04*  HCT 35.6* 37.2*  PLT 166 170    Basename 04/24/12 0410 04/23/12 0417  NA 135 135  K 4.3 4.8  CL 100 99  CO2 28 28  BUN 16 15  CREATININE 0.91 0.90  GLUCOSE 191* 250*  CALCIUM 8.6 8.5   No results found for this basename: LABPT:2,INR:2 in the last 72 hours  EXAM: General - Patient is Alert, Appropriate and Oriented Extremity - Neurovascular intact Sensation intact distally Incision - clean, dry, no drainage, healing Motor Function - intact, moving foot and toes well on exam.   Assessment/Plan: 2 Days Post-Op Procedure(s) (LRB): TOTAL KNEE ARTHROPLASTY (Left) Procedure(s) (LRB): TOTAL KNEE ARTHROPLASTY (Left) Past Medical History  Diagnosis Date  . Type 2 diabetes mellitus   . Unspecified hypothyroidism   . Other and unspecified hyperlipidemia   . Coronary artery disease   . BPH (benign prostatic hypertrophy)   . Sleep apnea 10 yrs ago    mild sleep apnea no cpap needed   Principal Problem:  *OA (osteoarthritis) of knee   Up with therapy Discharge home with home  health Diet - Cardiac diet and Diabetic diet Follow up - in 2 weeks Activity - WBAT Disposition - Home Condition Upon Discharge - Good D/C Meds - See DC Summary DVT Prophylaxis - Xarelto  Devery Murgia 04/24/2012, 10:02 AM

## 2012-04-24 NOTE — Evaluation (Signed)
Occupational Therapy Evaluation Patient Details Name: Nathan Navarro MRN: [id number] DOB: 1941/01/15 Today's Date: 04/24/2012 Time: 1000-1030 OT Time Calculation (min): 30 min  OT Assessment / Plan / Recommendation Clinical Impression  Pt presents with L TKR and is doing well with functional mobility and ADL. Pt supposed to discharge later today.  Has PRN assist by spouse at discharge. Will not set goals as pt to discharge today.    OT Assessment  Patient does not need any further OT services    Follow Up Recommendations  No OT follow up    Equipment Recommendations  None recommended by PT;None recommended by OT    Frequency      Precautions / Restrictions Precautions Precautions: Knee Required Braces or Orthoses: Knee Immobilizer - Left Knee Immobilizer - Left: Discontinue once straight leg raise with < 10 degree lag Restrictions Weight Bearing Restrictions: No LLE Weight Bearing: Weight bearing as tolerated        ADL  Eating/Feeding: Simulated;Independent Where Assessed - Eating/Feeding: Chair Grooming: Simulated;Set up Where Assessed - Grooming: Unsupported sitting Upper Body Bathing: Simulated;Chest;Right arm;Left arm;Abdomen;Set up Where Assessed - Upper Body Bathing: Sitting, bed;Unsupported Lower Body Bathing: Simulated;Minimal assistance for L foot Where Assessed - Lower Body Bathing: Sit to stand from bed Upper Body Dressing: Simulated;Set up Where Assessed - Upper Body Dressing: Sitting, bed;Unsupported Lower Body Dressing: Simulated;Minimal assistance to start pants over L foot and sock Where Assessed - Lower Body Dressing: Sit to stand from bed Toilet Transfer: Performed;Minimal assistance;Other (comment) (min guard assist but min/mod verbal cues for safety) Toilet Transfer Method: Ambulating Toilet Transfer Equipment: Raised toilet seat with arms (or 3-in-1 over toilet) Toileting - Clothing Manipulation: Simulated;Minimal assistance;Other (comment) (min  guard assist) Where Assessed - Toileting Clothing Manipulation: Standing Toileting - Hygiene: Simulated;Minimal assistance;Other (comment) (min guard assist) Where Assessed - Toileting Hygiene: Standing Tub/Shower Transfer: Not assessed Tub/Shower Transfer Method: Not assessed Equipment Used: Rolling walker ADL Comments: Pt states wife can assist with LB ADL. Demonstrated technique for shower transfer but pt declined practice. He verbalizes understanding. Will issue handout per his rerquest. Explained how to adjust 3in1 for his height and its use as a shower seat. Also emphasized use of KI until able to SLR including shower transfer if he still isnt doing SLR by the time he is allowed/does a shower.    OT Goals    Visit Information  Last OT Received On: 04/24/12 Assistance Needed: +1    Subjective Data  Subjective: I need to practice that toilet chair Patient Stated Goal: home   Prior Functioning  Home Living Lives With: Spouse Available Help at Discharge: Family Type of Home: House Home Access: Stairs to enter Secretary/administrator of Steps: 2 Entrance Stairs-Rails: None Home Layout: One level Bathroom Shower/Tub: Health visitor: Standard Home Adaptive Equipment: Walker - rolling;Bedside commode/3-in-1 Prior Function Level of Independence: Independent Communication Communication: HOH;Other (comment) (has hearing aids)    Cognition  Overall Cognitive Status: Appears within functional limits for tasks assessed/performed Arousal/Alertness: Awake/alert Orientation Level: Appears intact for tasks assessed Behavior During Session: [facility] for tasks performed Cognition - Other Comments: However, pt does jump ahead in conversation before OT able to finish education on one task.    Extremity/Trunk Assessment Right Upper Extremity Assessment RUE ROM/Strength/Tone: Within functional levels Left Upper Extremity Assessment LUE ROM/Strength/Tone: Within functional  levels   Mobility Bed Mobility Bed Mobility: Supine to Sit Supine to Sit: 4: Min assist Details for Bed Mobility Assistance: assist for L LE to the  EOB Transfers Transfers: Sit to Stand;Stand to Sit Sit to Stand: 4: Min guard;With upper extremity assist;From bed;From chair/3-in-1 Stand to Sit: 4: Min guard;Without upper extremity assist;To chair/3-in-1 Details for Transfer Assistance: verbal cues for safety with L LE placement and hand placement. Pt tends to also try to sit down before he is backed up completely to surface.    Exercise    Balance    End of Session OT - End of Session Activity Tolerance: Patient tolerated treatment well Patient left: in chair;with call bell/phone within reach   Lennox Laity [phone] 04/24/2012, 10:45 AM

## 2012-04-25 NOTE — Progress Notes (Signed)
CARE MANAGEMENT NOTE 04/25/2012  Patient:  Nathan Navarro, Nathan Navarro   Account Number:  [id number]  Date Initiated:  04/22/2012  Documentation initiated by:  Colleen Can  Subjective/Objective Assessment:   dx left total knee arthroplasty     Action/Plan:   CM spoke with patient andspouse. Plans are for patient to return to his home in [place] where spouse will be caregiver.   Anticipated DC Date:  04/24/2012   Anticipated DC Plan:  HOME W HOME HEALTH SERVICES  In-house referral  NA      DC Planning Services  CM consult      [facility] Choice  HOME HEALTH   Choice offered to / List presented to:  C-1 Patient   DME arranged  NA      DME agency  NA     HH arranged  HH-2 PT      [facility] agency  [facility]   Status of service:  Completed, signed off Medicare Important Message given?  NA - LOS <3 / Initial given by admissions (If response is "NO", the following Medicare IM given date fields will be blank) Date Medicare IM given:   Date Additional Medicare IM given:    Discharge Disposition:  HOME W HOME HEALTH SERVICES  Per UR Regulation:  Reviewed for med. necessity/level of care/duration of stay      Comments:  04/25/2012 Raynelle Bring BSN CCM [phone] PT DISCHARGED 04/24/2012 WITH [facility] TO START 04/25/2012

## 2012-05-08 NOTE — Discharge Summary (Signed)
Physician Discharge Summary   Patient ID: Nathan Navarro MRN: [id number] DOB/AGE: 1941/01/21 71 y.o.  Admit date: 04/22/2012 Discharge date: 04/24/2012  Primary Diagnosis: Osteoarthritis Left Knee  Admission Diagnoses:  Past Medical History  Diagnosis Date  . Type 2 diabetes mellitus   . Unspecified hypothyroidism   . Other and unspecified hyperlipidemia   . Coronary artery disease   . BPH (benign prostatic hypertrophy)   . Sleep apnea 10 yrs ago    mild sleep apnea no cpap needed   Discharge Diagnoses:   Principal Problem:  *OA (osteoarthritis) of knee  Procedure:  Procedure(s) (LRB): TOTAL KNEE ARTHROPLASTY (Left)   Consults: None  HPI: Nathan Navarro is a 71 y.o. year old male with end stage OA of his left knee with progressively worsening pain and dysfunction. He has constant pain, with activity and at rest and significant functional deficits with difficulties even with ADLs. He has had extensive non-op management including analgesics, injections of cortisone and viscosupplements, and home exercise program, but remains in significant pain with significant dysfunction. Radiographs show bone on bone arthritis medial and patellofemoral. He presents now for left Total Knee Arthroplasty.    Laboratory Data: Hospital Outpatient Visit on 04/15/2012  Component Date Value Range Status  . MRSA, PCR  04/15/2012 NEGATIVE  NEGATIVE Final  . Staphylococcus aureus  04/15/2012 NEGATIVE  NEGATIVE Final   Comment:                                 The Xpert SA Assay (FDA                          approved for NASAL specimens                          only), is one component of                          a comprehensive surveillance                          program.  It is not intended                          to diagnose infection nor to                          guide or monitor treatment.  . WBC (K/uL) 04/15/2012 6.7  4.0-10.5 Final  . RBC (MIL/uL) 04/15/2012 4.98  4.22-5.81 Final  .  Hemoglobin (g/dL) 40/98/1191 47.8  29.5-62.1 Final  . HCT (%) 04/15/2012 45.9  39.0-52.0 Final  . MCV (fL) 04/15/2012 92.2  78.0-100.0 Final  . MCH (pg) 04/15/2012 30.7  26.0-34.0 Final  . MCHC (g/dL) 30/86/5784 69.6  29.5-28.4 Final  . RDW (%) 04/15/2012 14.2  11.5-15.5 Final  . Platelets (K/uL) 04/15/2012 188  150-400 Final  . aPTT (seconds) 04/15/2012 33  24-37 Final  . Sodium (mEq/L) 04/15/2012 138  135-145 Final  . Potassium (mEq/L) 04/15/2012 4.7  3.5-5.1 Final  . Chloride (mEq/L) 04/15/2012 100  96-112 Final  . CO2 (mEq/L) 04/15/2012 32  19-32 Final  . Glucose, Bld (mg/dL) 13/24/4010 272* 53-66 Final  . BUN (mg/dL) 44/02/4741 13  5-95 Final  . Creatinine, Ser (mg/dL)  04/15/2012 0.93  0.50-1.35 Final  . Calcium (mg/dL) 16/09/9603 54.0  9.8-11.9 Final  . Total Protein (g/dL) 14/78/2956 7.4  2.1-3.0 Final  . Albumin (g/dL) 86/57/8469 4.0  6.2-9.5 Final  . AST (U/L) 04/15/2012 18  0-37 Final  . ALT (U/L) 04/15/2012 17  0-53 Final  . Alkaline Phosphatase (U/L) 04/15/2012 47  39-117 Final  . Total Bilirubin (mg/dL) 28/41/3244 0.5  0.1-0.2 Final  . GFR calc non Af Amer (mL/min) 04/15/2012 84* >90 Final  . GFR calc Af Amer (mL/min) 04/15/2012 >90  >90 Final   Comment:                                 The eGFR has been calculated                          using the CKD EPI equation.                          This calculation has not been                          validated in all clinical                          situations.                          eGFR's persistently                          <90 mL/min signify                          possible Chronic Kidney Disease.  . Color, Urine  04/15/2012 YELLOW  YELLOW Final  . APPearance  04/15/2012 CLEAR  CLEAR Final  . Specific Gravity, Urine  04/15/2012 1.022  1.005-1.030 Final  . pH  04/15/2012 6.0  5.0-8.0 Final  . Glucose, UA (mg/dL) 72/53/6644 NEGATIVE  NEGATIVE Final  . Hgb urine dipstick  04/15/2012 NEGATIVE  NEGATIVE Final  .  Bilirubin Urine  04/15/2012 NEGATIVE  NEGATIVE Final  . Ketones, ur (mg/dL) 03/47/4259 NEGATIVE  NEGATIVE Final  . Protein, ur (mg/dL) 56/38/7564 NEGATIVE  NEGATIVE Final  . Urobilinogen, UA (mg/dL) 33/29/5188 0.2  4.1-6.6 Final  . Nitrite  04/15/2012 NEGATIVE  NEGATIVE Final  . Leukocytes, UA  04/15/2012 NEGATIVE  NEGATIVE Final   MICROSCOPIC NOT DONE ON URINES WITH NEGATIVE PROTEIN, BLOOD, LEUKOCYTES, NITRITE, OR GLUCOSE <1000 mg/dL.  Marland Kitchen Prothrombin Time (seconds) 04/15/2012 13.6  11.6-15.2 Final  . INR  04/15/2012 1.02  0.00-1.49 Final   No results found for this basename: HGB:5 in the last 72 hours No results found for this basename: WBC:2,RBC:2,HCT:2,PLT:2 in the last 72 hours No results found for this basename: NA:2,K:2,CL:2,CO2:2,BUN:2,CREATININE:2,GLUCOSE:2,CALCIUM:2 in the last 72 hours No results found for this basename: LABPT:2,INR:2 in the last 72 hours  X-Rays:Dg Chest 2 View  04/15/2012  *RADIOLOGY REPORT*  Clinical Data: Preop.  CHEST - 2 VIEW  Comparison: 03/09/2012.  Findings: Trachea is midline.  Heart size normal.  Pleural scarring at the base of the left hemithorax.  Lungs are otherwise clear.  No pleural fluid.  IMPRESSION: No acute findings.  Original Report Authenticated By:  Reyes Ivan, M.D.    EKG:No orders found for this or any previous visit.   Hospital Course: Patient was admitted to [facility] and taken to the OR and underwent the above state procedure without complications.  Patient tolerated the procedure well and was later transferred to the recovery room and then to the orthopaedic floor for postoperative care.  They were given PO and IV analgesics for pain control following their surgery.  They were given 24 hours of postoperative antibiotics and started on DVT prophylaxis in the form of Xarelto.   PT and OT were ordered for total joint protocol.  Discharge planning consulted to help with postop disposition and equipment needs.  Patient had a good  night on the evening of surgery and started to get up OOB with therapy on day one.  PCA Dilaudid was discontinued and they were weaned over to PO meds.  Hemovac drain was pulled without difficulty.  Continued to work with therapy into day two.  Dressing was changed on day two and the incision was healing well.  Patient was seen in rounds and was ready to go home later that day.  Discharge Medications: Prior to Admission medications   Medication Sig Start Date End Date Taking? Authorizing Provider  Insulin Glargine (LANTUS SOLOSTAR [place]) Inject 40 Units into the skin every morning.    Yes Historical Provider, MD  insulin lispro (HUMALOG) 100 UNIT/ML injection Inject 10 Units into the skin every evening. 10 units prior to your dinner meal 02/24/12  Yes Dois Davenport, MD  levothyroxine (SYNTHROID, LEVOTHROID) 200 MCG tablet Take 200 mcg by mouth every morning. 03/15/12  Yes Rickard Patience, PA-C  metFORMIN (GLUCOPHAGE) 1000 MG tablet Take 1,000 mg by mouth 2 (two) times daily with a meal.   Yes Historical Provider, MD  nebivolol (BYSTOLIC) 5 MG tablet Take 5 mg by mouth every morning.    Yes Historical Provider, MD  rosuvastatin (CRESTOR) 20 MG tablet Take 20 mg by mouth every morning.    Yes Historical Provider, MD  Insulin Pen Needle (PEN NEEDLES 31GX5/16") 31G X 8 MM MISC Use as directed with insulin pens 02/27/12   Dois Davenport, MD  rivaroxaban (XARELTO) 10 MG TABS tablet Take 1 tablet (10 mg total) by mouth daily with breakfast. Take for two and a half more weeks, then discontinue Xarelto. After completing the Xarelto, the patient may resume their 81 mg Aspirin daily. 04/24/12   Cottrell Gentles Julien Girt, PA    Diet: carb modified - medium Activity:WBAT Follow-up:in 2 weeks Disposition - Home Discharged Condition: good   Discharge Orders    Future Orders Please Complete By Expires   Diet - low sodium heart healthy      Diet Carb Modified      Call MD / Call 911      Comments:   If you experience  chest pain or shortness of breath, CALL 911 and be transported to the hospital emergency room.  If you develope a fever above 101 F, pus (white drainage) or increased drainage or redness at the wound, or calf pain, call your surgeon's office.   Discharge instructions      Comments:   Pick up stool softner and laxative for home. Do not submerge incision under water. May shower. Continue to use ice for pain and swelling from surgery.   Take Xarelto for two and a half more weeks, then discontinue Xarelto.  After completing the Xarelto, the patient may resume their 58  mg Aspirin.     Constipation Prevention      Comments:   Drink plenty of fluids.  Prune juice may be helpful.  You may use a stool softener, such as Colace (over the counter) 100 mg twice a day.  Use MiraLax (over the counter) for constipation as needed.   Increase activity slowly as tolerated      Weight Bearing as taught in Physical Therapy      Comments:   Use a walker or crutches as instructed.   Patient may shower      Comments:   You may shower without a dressing once there is no drainage.  Do not wash over the wound.  If drainage remains, do not shower until drainage stops.   Driving restrictions      Comments:   No driving until released by the physician.   Lifting restrictions      Comments:   No lifting until released by the physician.   TED hose      Comments:   Use stockings (TED hose) for 3 weeks on both leg(s).  You may remove them at night for sleeping.   Change dressing      Comments:   Change dressing daily with sterile 4 x 4 inch gauze dressing and apply TED hose. Do not submerge the incision under water.   Do not put a pillow under the knee. Place it under the heel.      Do not sit on low chairs, stoools or toilet seats, as it may be difficult to get up from low surfaces        Medication List  As of 05/08/2012  8:34 PM   STOP taking these medications         aspirin 81 MG chewable tablet       fish oil-omega-3 fatty acids 1000 MG capsule      mulitivitamin with minerals Tabs      tadalafil 10 MG tablet         TAKE these medications         insulin lispro 100 UNIT/ML injection   Commonly known as: HUMALOG   Inject 10 Units into the skin every evening. 10 units prior to your dinner meal      LANTUS SOLOSTAR [place]   Inject 40 Units into the skin every morning.      levothyroxine 200 MCG tablet   Commonly known as: SYNTHROID, LEVOTHROID   Take 200 mcg by mouth every morning.      metFORMIN 1000 MG tablet   Commonly known as: GLUCOPHAGE   Take 1,000 mg by mouth 2 (two) times daily with a meal.      nebivolol 5 MG tablet   Commonly known as: BYSTOLIC   Take 5 mg by mouth every morning.      PEN NEEDLES 31GX5/16" 31G X 8 MM Misc   Use as directed with insulin pens      rivaroxaban 10 MG Tabs tablet   Commonly known as: XARELTO   Take 1 tablet (10 mg total) by mouth daily with breakfast. Take for two and a half more weeks, then discontinue Xarelto. After completing the Xarelto, the patient may resume their 81 mg Aspirin daily.      rosuvastatin 20 MG tablet   Commonly known as: CRESTOR   Take 20 mg by mouth every morning.           Follow-up Information    Follow up with Loanne Drilling,  MD. Schedule an appointment as soon as possible for a visit in 2 weeks.   Contact information:   [facility] [address] [place] [address] [phone]          Signed: Patrica Duel 05/08/2012, 8:34 PM

## 2012-06-26 ENCOUNTER — Encounter (HOSPITAL_COMMUNITY): Payer: Self-pay | Admitting: *Deleted

## 2012-06-26 NOTE — Pre-Procedure Instructions (Signed)
SAME DAY SURGERY INSTRUCTIONS WERE GIVEN TO PT BY PHONE.  PT ALSO INSTRUCTED TO DO THE BETASEPT SOAP SHOWER THE NIGHT BEFORE AND THE AM OF SURGERY-BUT REMINDED NOT TO USE ON FACE, HEAD OR PRIVATE AREAS.

## 2012-06-28 ENCOUNTER — Other Ambulatory Visit: Payer: Self-pay | Admitting: Surgical

## 2012-06-28 NOTE — Addendum Note (Signed)
Addended by: Dimitri Ped on: 06/28/2012 12:02 PM   Modules accepted: Orders

## 2012-06-30 ENCOUNTER — Other Ambulatory Visit: Payer: Self-pay | Admitting: Orthopedic Surgery

## 2012-06-30 MED ORDER — DEXAMETHASONE SODIUM PHOSPHATE 10 MG/ML IJ SOLN: 10.0000 mg | Freq: Once | INTRAMUSCULAR | Status: DC

## 2012-07-01 ENCOUNTER — Encounter (HOSPITAL_COMMUNITY): Payer: Self-pay | Admitting: Anesthesiology

## 2012-07-01 ENCOUNTER — Ambulatory Visit (HOSPITAL_COMMUNITY)
Admission: RE | Admit: 2012-07-01 | Discharge: 2012-07-01 | Disposition: A | Discharge status: Home / Self Care | Payer: Medicare Other | Source: Ambulatory Visit | Attending: Orthopedic Surgery | Admitting: Orthopedic Surgery

## 2012-07-01 ENCOUNTER — Ambulatory Visit (HOSPITAL_COMMUNITY): Payer: Medicare Other | Admitting: Anesthesiology

## 2012-07-01 ENCOUNTER — Encounter (HOSPITAL_COMMUNITY)
Admission: RE | Disposition: A | Discharge status: Home / Self Care | Payer: Self-pay | Source: Ambulatory Visit | Attending: Orthopedic Surgery

## 2012-07-01 ENCOUNTER — Encounter (HOSPITAL_COMMUNITY): Payer: Self-pay | Admitting: *Deleted

## 2012-07-01 ENCOUNTER — Other Ambulatory Visit: Payer: Self-pay

## 2012-07-01 DIAGNOSIS — G4730 Sleep apnea, unspecified: Secondary | ICD-10-CM | POA: Insufficient documentation

## 2012-07-01 DIAGNOSIS — Z96659 Presence of unspecified artificial knee joint: Secondary | ICD-10-CM | POA: Insufficient documentation

## 2012-07-01 DIAGNOSIS — Z951 Presence of aortocoronary bypass graft: Secondary | ICD-10-CM | POA: Insufficient documentation

## 2012-07-01 DIAGNOSIS — Z79899 Other long term (current) drug therapy: Secondary | ICD-10-CM | POA: Insufficient documentation

## 2012-07-01 DIAGNOSIS — M1710 Unilateral primary osteoarthritis, unspecified knee: Secondary | ICD-10-CM

## 2012-07-01 DIAGNOSIS — E785 Hyperlipidemia, unspecified: Secondary | ICD-10-CM | POA: Insufficient documentation

## 2012-07-01 DIAGNOSIS — I2510 Atherosclerotic heart disease of native coronary artery without angina pectoris: Secondary | ICD-10-CM | POA: Insufficient documentation

## 2012-07-01 DIAGNOSIS — Z794 Long term (current) use of insulin: Secondary | ICD-10-CM | POA: Insufficient documentation

## 2012-07-01 DIAGNOSIS — E119 Type 2 diabetes mellitus without complications: Secondary | ICD-10-CM | POA: Insufficient documentation

## 2012-07-01 DIAGNOSIS — M24669 Ankylosis, unspecified knee: Secondary | ICD-10-CM | POA: Insufficient documentation

## 2012-07-01 DIAGNOSIS — E039 Hypothyroidism, unspecified: Secondary | ICD-10-CM | POA: Insufficient documentation

## 2012-07-01 DIAGNOSIS — Z7982 Long term (current) use of aspirin: Secondary | ICD-10-CM | POA: Insufficient documentation

## 2012-07-01 HISTORY — PX: KNEE CLOSED REDUCTION: SHX995

## 2012-07-01 LAB — URINALYSIS, ROUTINE W REFLEX MICROSCOPIC
Bilirubin Urine: NEGATIVE
Glucose, UA: NEGATIVE mg/dL
Hgb urine dipstick: NEGATIVE
Ketones, ur: NEGATIVE mg/dL
Leukocytes, UA: NEGATIVE
Nitrite: NEGATIVE
Protein, ur: NEGATIVE mg/dL
Specific Gravity, Urine: 1.01 (ref 1.005–1.030)
Urobilinogen, UA: 0.2 mg/dL (ref 0.0–1.0)
pH: 6 (ref 5.0–8.0)

## 2012-07-01 LAB — COMPREHENSIVE METABOLIC PANEL
ALT: 16 U/L (ref 0–53)
AST: 15 U/L (ref 0–37)
Albumin: 4 g/dL (ref 3.5–5.2)
Alkaline Phosphatase: 53 U/L (ref 39–117)
BUN: 15 mg/dL (ref 6–23)
CO2: 30 mEq/L (ref 19–32)
Calcium: 9.4 mg/dL (ref 8.4–10.5)
Chloride: 99 mEq/L (ref 96–112)
Creatinine, Ser: 0.87 mg/dL (ref 0.50–1.35)
GFR calc Af Amer: >90 mL/min (ref >90)
GFR calc non Af Amer: 86 mL/min — ABNORMAL LOW (ref >90)
Glucose, Bld: 145 mg/dL — ABNORMAL HIGH (ref 70–99)
Potassium: 4.5 mEq/L (ref 3.5–5.1)
Sodium: 135 mEq/L (ref 135–145)
Total Bilirubin: 0.4 mg/dL (ref 0.3–1.2)
Total Protein: 7.2 g/dL (ref 6.0–8.3)

## 2012-07-01 LAB — DIFFERENTIAL
Basophils Absolute: 0.1 10*3/uL (ref 0.0–0.1)
Basophils Relative: 1 % (ref 0–1)
Eosinophils Absolute: 0.2 10*3/uL (ref 0.0–0.7)
Eosinophils Relative: 2 % (ref 0–5)
Lymphocytes Relative: 29 % (ref 12–46)
Lymphs Abs: 2.1 10*3/uL (ref 0.7–4.0)
Monocytes Absolute: 0.7 10*3/uL (ref 0.1–1.0)
Monocytes Relative: 9 % (ref 3–12)
Neutro Abs: 4.3 10*3/uL (ref 1.7–7.7)
Neutrophils Relative %: 59 % (ref 43–77)

## 2012-07-01 LAB — PROTIME-INR
INR: 1.01 (ref 0.00–1.49)
Prothrombin Time: 13.5 seconds (ref 11.6–15.2)

## 2012-07-01 LAB — APTT: aPTT: 31 seconds (ref 24–37)

## 2012-07-01 LAB — CBC
HCT: 45.8 % (ref 39.0–52.0)
Hemoglobin: 15 g/dL (ref 13.0–17.0)
MCH: 29.3 pg (ref 26.0–34.0)
MCHC: 32.8 g/dL (ref 30.0–36.0)
MCV: 89.5 fL (ref 78.0–100.0)
Platelets: 190 10*3/uL (ref 150–400)
RBC: 5.12 MIL/uL (ref 4.22–5.81)
RDW: 13.9 % (ref 11.5–15.5)
WBC: 7.3 10*3/uL (ref 4.0–10.5)

## 2012-07-01 LAB — GLUCOSE, CAPILLARY
Glucose-Capillary: 130 mg/dL — ABNORMAL HIGH (ref 70–99)
Glucose-Capillary: 105 mg/dL — ABNORMAL HIGH (ref 70–99)

## 2012-07-01 SURGERY — MANIPULATION, KNEE, CLOSED
Anesthesia: General | Site: Knee | Laterality: Left | Wound class: Clean

## 2012-07-01 MED ORDER — LACTATED RINGERS IV SOLN: INTRAVENOUS | Status: DC

## 2012-07-01 MED ORDER — SODIUM CHLORIDE 0.9 % IV SOLN: INTRAVENOUS | Status: DC

## 2012-07-01 MED ORDER — ACETAMINOPHEN 10 MG/ML IV SOLN
1000.0000 mg | Freq: Once | INTRAVENOUS | Status: DC
  Filled 2012-07-01: qty 100

## 2012-07-01 MED ORDER — CHLORHEXIDINE GLUCONATE 4 % EX LIQD
60.0000 mL | Freq: Once | CUTANEOUS | Status: DC
  Filled 2012-07-01: qty 60

## 2012-07-01 MED ORDER — MIDAZOLAM HCL 5 MG/5ML IJ SOLN
INTRAMUSCULAR | Status: DC | PRN
  Administered 2012-07-01: 2 mg via INTRAVENOUS

## 2012-07-01 MED ORDER — FENTANYL CITRATE 0.05 MG/ML IJ SOLN
INTRAMUSCULAR | Status: DC | PRN
  Administered 2012-07-01: 100 ug via INTRAVENOUS

## 2012-07-01 MED ORDER — LIDOCAINE HCL (CARDIAC) 20 MG/ML IV SOLN
INTRAVENOUS | Status: DC | PRN
  Administered 2012-07-01: 75 mg via INTRAVENOUS

## 2012-07-01 MED ORDER — HYDROMORPHONE HCL PF 1 MG/ML IJ SOLN: 0.2500 mg | INTRAMUSCULAR | Status: DC | PRN

## 2012-07-01 MED ORDER — OXYCODONE HCL 5 MG PO TABS
5.0000 mg | ORAL_TABLET | Freq: Once | ORAL | Status: AC
  Administered 2012-07-01: 5 mg via ORAL

## 2012-07-01 MED ORDER — OXYCODONE HCL 5 MG PO TABS
ORAL_TABLET | ORAL | Status: AC
  Filled 2012-07-01: qty 1

## 2012-07-01 MED ORDER — PROMETHAZINE HCL 25 MG/ML IJ SOLN: 6.2500 mg | INTRAMUSCULAR | Status: DC | PRN

## 2012-07-01 MED ORDER — LACTATED RINGERS IV SOLN
INTRAVENOUS | Status: DC | PRN
  Administered 2012-07-01: 17:00:00 via INTRAVENOUS

## 2012-07-01 MED ORDER — MUPIROCIN 2 % EX OINT
TOPICAL_OINTMENT | CUTANEOUS | Status: AC
  Filled 2012-07-01: qty 22

## 2012-07-01 MED ORDER — PROPOFOL 10 MG/ML IV BOLUS
INTRAVENOUS | Status: DC | PRN
  Administered 2012-07-01: 200 mg via INTRAVENOUS

## 2012-07-01 MED ORDER — OXYCODONE HCL 5 MG PO TABS: 5.0000 mg | ORAL_TABLET | ORAL | Status: AC | PRN

## 2012-07-01 SURGICAL SUPPLY — 12 items
BANDAGE ADHESIVE 1X3 (GAUZE/BANDAGES/DRESSINGS) IMPLANT
CLOTH BEACON ORANGE TIMEOUT ST (SAFETY) IMPLANT
GLOVE BIO SURGEON STRL SZ7.5 (GLOVE) IMPLANT
GOWN STRL NON-REIN LRG LVL3 (GOWN DISPOSABLE) IMPLANT
GOWN STRL REIN XL XLG (GOWN DISPOSABLE) IMPLANT
MANIFOLD NEPTUNE II (INSTRUMENTS) IMPLANT
NDL SAFETY ECLIPSE 18X1.5 (NEEDLE) IMPLANT
NEEDLE HYPO 18GX1.5 SHARP (NEEDLE)
POSITIONER SURGICAL ARM (MISCELLANEOUS) IMPLANT
SPONGE GAUZE 4X4 12PLY (GAUZE/BANDAGES/DRESSINGS) IMPLANT
SYR CONTROL 10ML LL (SYRINGE) IMPLANT
TOWEL OR 17X26 10 PK STRL BLUE (TOWEL DISPOSABLE) IMPLANT

## 2012-07-01 NOTE — Transfer of Care (Signed)
Immediate Anesthesia Transfer of Care Note  Patient: Nathan Navarro  Procedure(s) Performed: Procedure(s) (LRB): CLOSED MANIPULATION KNEE (Left)  Patient Location: PACU  Anesthesia Type: General  Level of Consciousness: awake, alert , oriented, patient cooperative and responds to stimulation  Airway & Oxygen Therapy: Patient Spontanous Breathing and Patient connected to face mask oxygen  Post-op Assessment: Report given to PACU RN, Post -op Vital signs reviewed and stable and Patient moving all extremities X 4  Post vital signs: stable  Complications: No apparent anesthesia complications

## 2012-07-01 NOTE — Interval H&P Note (Signed)
History and Physical Interval Note:  07/01/2012 5:11 PM  Nathan Navarro  has presented today for surgery, with the diagnosis of Left Knee Arthrofibrosis  The various methods of treatment have been discussed with the patient and family. After consideration of risks, benefits and other options for treatment, the patient has consented to  Procedure(s) (LRB): CLOSED MANIPULATION KNEE (Left) as a surgical intervention .  The patient's history has been reviewed, patient examined, no change in status, stable for surgery.  I have reviewed the patients' chart and labs.  Questions were answered to the patient's satisfaction.     Loanne Drilling

## 2012-07-01 NOTE — H&P (Signed)
  CC- Nathan Navarro is a 71 y.o. male who presents with left knee stiffness.  HPI- . Knee Pain: Patient presents with stiffness involving the  left knee. He had a left TKA on 04/22/12 and did very well initially but has had limited range of motion despite adequate formalized physical therapy. He has not improved his range of motion in the past month and is stuck at approximately 90 degrees of flexion. He presents now for closed manipulation.  Past Medical History  Diagnosis Date  . Type 2 diabetes mellitus   . Unspecified hypothyroidism   . Other and unspecified hyperlipidemia   . Coronary artery disease   . BPH (benign prostatic hypertrophy)   . Sleep apnea 10 yrs ago    mild sleep apnea no cpap needed    Past Surgical History  Procedure Date  . Lumbar spine surgery 1986  . Knee arthroscopy 2009    R knee  . Coronary artery bypass graft 1981 x 3, 2000 repair 2 and  did 2 more  . Total knee arthroplasty 04/22/2012    Procedure: TOTAL KNEE ARTHROPLASTY;  Surgeon: Loanne Drilling, MD;  Location: [facility];  Service: Orthopedics;  Laterality: Left;    Prior to Admission medications   Medication Sig Start Date End Date Taking? Authorizing Provider  aspirin 81 MG tablet Take 81 mg by mouth daily.   Yes Historical Provider, MD  fish oil-omega-3 fatty acids 1000 MG capsule Take 1 g by mouth daily.   Yes Historical Provider, MD  Insulin Glargine (LANTUS SOLOSTAR [place]) Inject 40 Units into the skin every morning.    Yes Historical Provider, MD  insulin lispro (HUMALOG) 100 UNIT/ML injection Inject 10 Units into the skin daily with supper. 10 units prior to your dinner meal 02/24/12  Yes Dois Davenport, MD  Insulin Pen Needle (PEN NEEDLES 31GX5/16") 31G X 8 MM MISC Use as directed with insulin pens 02/27/12  Yes Dois Davenport, MD  levothyroxine (SYNTHROID, LEVOTHROID) 200 MCG tablet Take 200 mcg by mouth every morning. 03/15/12  Yes Rickard Patience, PA-C  metFORMIN (GLUCOPHAGE) 1000 MG tablet Take  1,000 mg by mouth 2 (two) times daily with a meal.   Yes Historical Provider, MD  Multiple Vitamin (MULTIVITAMIN) tablet Take 1 tablet by mouth daily.   Yes Historical Provider, MD  nebivolol (BYSTOLIC) 5 MG tablet Take 5 mg by mouth every morning.    Yes Historical Provider, MD  rosuvastatin (CRESTOR) 20 MG tablet Take 40 mg by mouth every morning.    Yes Historical Provider, MD   KNEE EXAM antalgic gait, reduced range of motion, collateral ligaments intact Range of motion- 10-90  Physical Examination: General appearance - alert, well appearing, and in no distress Mental status - alert, oriented to person, place, and time Chest - clear to auscultation, no wheezes, rales or rhonchi, symmetric air entry Heart - normal rate, regular rhythm, normal S1, S2, no murmurs, rubs, clicks or gallops Abdomen - soft, nontender, nondistended, no masses or organomegaly Neurological - alert, oriented, normal speech, no focal findings or movement disorder noted   Asessment/Plan--- Left knee arthrofibrosis- - Plan left knee closed manipulation. Procedure risks and potential comps discussed with patient who elects to proceed. Goals are decreased pain and increased function with a high likelihood of achieving both

## 2012-07-01 NOTE — Op Note (Signed)
  OPERATIVE REPORT   PREOPERATIVE DIAGNOSIS: Arthrofibrosis, Left  knee.   POSTOPERATIVE DIAGNOSIS: Arthrofibrosis, Left knee.   PROCEDURE:  Left  knee closed manipulation.   SURGEON: Ollen Gross, MD   ASSISTANT: None.   ANESTHESIA: General.   COMPLICATIONS: None.   CONDITION: Stable to Recovery.   Pre-manipulation range of motion is 10-90.  Post-manipulation range of  Motion is 5-125  PROCEDURE IN DETAIL: After successful administration of general  anesthetic, exam under anesthesia was performed showing range of motion  10-90 degrees. I then placed my chest against the proximal tibia,  flexing the knee with audible lysis of adhesions. I was easily able to  get the knee flexed to 125  degrees. I then put the knee back in extension and with some  patellar manipulation and gentle pressure got within 5 degrees of full  Extension.The patient was subsequently awakened and transported to Recovery in  stable condition.  Nathan Navarro Nathan Bridwell, MD    07/01/2012, 5:42 PM

## 2012-07-01 NOTE — Anesthesia Postprocedure Evaluation (Signed)
Anesthesia Post Note  Patient: Nathan Navarro  Procedure(s) Performed: Procedure(s) (LRB): CLOSED MANIPULATION KNEE (Left)  Anesthesia type: General  Patient location: PACU  Post pain: Pain level controlled  Post assessment: Post-op Vital signs reviewed  Last Vitals:  Filed Vitals:   07/01/12 1800  BP: 135/63  Pulse: 60  Temp:   Resp: 14    Post vital signs: Reviewed  Level of consciousness: sedated  Complications: No apparent anesthesia complications

## 2012-07-01 NOTE — Anesthesia Preprocedure Evaluation (Signed)
Anesthesia Evaluation  Patient identified by MRN, date of birth, ID band Patient awake    Reviewed: Allergy & Precautions, H&P , NPO status , Patient's Chart, lab work & pertinent test results  Airway Mallampati: II TM Distance: >3 FB Neck ROM: Full    Dental No notable dental hx. (+) Teeth Intact and Dental Advisory Given   Pulmonary neg pulmonary ROS, sleep apnea ,  breath sounds clear to auscultation  Pulmonary exam normal       Cardiovascular + CAD, + CABG (CABG x 3; followed by CABG x 4; denies cardiac symptoms) and +CHF Rhythm:Regular Rate:Normal     Neuro/Psych negative neurological ROS  negative psych ROS   GI/Hepatic negative GI ROS, Neg liver ROS,   Endo/Other  Type 2, Insulin Dependent and Oral Hypoglycemic AgentsHypothyroidism   Renal/GU negative Renal ROS  negative genitourinary   Musculoskeletal negative musculoskeletal ROS (+)   Abdominal   Peds negative pediatric ROS (+)  Hematology negative hematology ROS (+)   Anesthesia Other Findings   Reproductive/Obstetrics negative OB ROS                           Anesthesia Physical Anesthesia Plan  ASA: III  Anesthesia Plan: General   Post-op Pain Management:    Induction: Intravenous  Airway Management Planned: LMA  Additional Equipment:   Intra-op Plan:   Post-operative Plan: Extubation in OR  Informed Consent: I have reviewed the patients History and Physical, chart, labs and discussed the procedure including the risks, benefits and alternatives for the proposed anesthesia with the patient or authorized representative who has indicated his/her understanding and acceptance.   Dental advisory given  Plan Discussed with: CRNA  Anesthesia Plan Comments:         Anesthesia Quick Evaluation

## 2012-07-03 ENCOUNTER — Encounter (HOSPITAL_COMMUNITY): Payer: Self-pay | Admitting: Orthopedic Surgery

## 2012-07-22 ENCOUNTER — Telehealth: Payer: Self-pay

## 2012-07-22 MED ORDER — INSULIN LISPRO 100 UNIT/ML ~~LOC~~ SOLN: 10.0000 [IU] | Freq: Every day | SUBCUTANEOUS | Status: DC

## 2012-07-22 NOTE — Telephone Encounter (Signed)
The patient's wife called regarding the patient's Humalog 100u/ml injectable pen.  The patient is on his last pen and needs new Rx called into [facility] at [phone].  The patient's wife stated she wanted to make sure the correct insulin was called in to [facility].  The patient needs the Humalog injectable pen, not the Lantus Rx- he is fine on Lantus Rx for now.  Please call patient at [phone] with any questions.

## 2012-07-22 NOTE — Telephone Encounter (Signed)
Called pt to advise his pen is being sent, he wants it to go to [place] [facility], I have sent there for him. He is aware he is due for follow up

## 2012-07-22 NOTE — Telephone Encounter (Signed)
Please advise looks like patient due for follow up according to Dr Hal Hope note from 03/20/12, requests Humalog pen, to [facility]. Please advise

## 2012-07-22 NOTE — Telephone Encounter (Signed)
Authorized 1 Pen to be dispensed. Printed - probably will not be approved by [facility]. Patient can fill at local pharmacy and then come in for follow up. Needs OV for further refills.

## 2012-08-19 ENCOUNTER — Other Ambulatory Visit: Payer: Self-pay | Admitting: Orthopedic Surgery

## 2012-08-19 MED ORDER — BUPIVACAINE 0.25 % ON-Q PUMP SINGLE CATH 300ML: 300.0000 mL | INJECTION | Status: DC

## 2012-08-19 MED ORDER — DEXAMETHASONE SODIUM PHOSPHATE 10 MG/ML IJ SOLN: 10.0000 mg | Freq: Once | INTRAMUSCULAR | Status: DC

## 2012-08-19 NOTE — Progress Notes (Signed)
Preoperative surgical orders have been place into the Epic hospital system for [facility] on 08/19/2012, 8:38 AM  by Patrica Duel for surgery on 09/16/2012.  Preop Total Knee orders including Bupivacaine On-Q pump, IV Tylenol, and IV Decadron as long as there are no contraindications to the above medications. Avel Peace, PA-C

## 2012-09-03 ENCOUNTER — Encounter (HOSPITAL_COMMUNITY): Payer: Self-pay | Admitting: Pharmacy Technician

## 2012-09-04 ENCOUNTER — Encounter: Payer: Self-pay | Admitting: Family Medicine

## 2012-09-04 ENCOUNTER — Ambulatory Visit (INDEPENDENT_AMBULATORY_CARE_PROVIDER_SITE_OTHER): Payer: Medicare Other | Admitting: Family Medicine

## 2012-09-04 VITALS — BP 124/80 | HR 55 | Temp 97.9°F | Resp 16 | Ht 72.0 in | Wt 240.0 lb

## 2012-09-04 DIAGNOSIS — Z23 Encounter for immunization: Secondary | ICD-10-CM

## 2012-09-04 DIAGNOSIS — E039 Hypothyroidism, unspecified: Secondary | ICD-10-CM

## 2012-09-04 DIAGNOSIS — E119 Type 2 diabetes mellitus without complications: Secondary | ICD-10-CM

## 2012-09-04 MED ORDER — LEVOTHYROXINE SODIUM 200 MCG PO TABS: 200.0000 ug | ORAL_TABLET | Freq: Every morning | ORAL | Status: DC

## 2012-09-04 NOTE — Progress Notes (Signed)
S: This 71 y.o Cauc male has Type II Dm, CAD followed by [facility]) and hypothyroidism. He will undergo right knee surgery by Dr. Lequita Halt on 09/16/2012. He is not needing pre-op clearance from this provider but is here for DM follow-up. He is not doing FSBS ("I don't like sticking my fingers"; he denies hypoglycemic symptoms and fells "fine". Nutrition is not optimal; he sometimes has oatmeal for breakfast and sometimes has a honeybun. He exercises almost daily at the gym and plays golf every chance he gets. He is not sur ewhy Dr. Hal Hope started the Humalog; he does take a dose every evening with dinner.   He is proud of having lost 23 lbs since last visit here in March.  O:  Filed Vitals:   09/04/12 0931  BP: 124/80  Pulse: 55  Temp: 97.9 F (36.6 C)  Resp: 16   GEN: In NAD: WN,WD. HENT: [place]/AT; EOMI, conj/scl clear. LUNGS: Normal resp rate and effort. COR: RRR. MS: Well healed left TKR scar. SKIN: Multiple well scars along medial aspects of lower legs (vein harvest sites for CABG surgeries x 2). NEURO: A&O x 3; CNs intact; otherwise normal.   Results for orders placed in visit on 09/04/12  POCT GLYCOSYLATED HEMOGLOBIN (HGB A1C)      Component Value Range   Hemoglobin A1C 7.4    GLUCOSE, POCT (MANUAL RESULT ENTRY)      Component Value Range   POC Glucose 96  70 - 99 mg/dl    A/P: 1. Type II or unspecified type diabetes mellitus without mention of complication, uncontrolled - A1C improved from 8.8% in March 2013 Continue current medication regimen; advised about limiting concentrated sweets Continue active lifestyle  2. Hypothyroidism  RF: Levothyroxine and fax to [facility]  3. Need for prophylactic vaccination and inoculation against influenza  Flu vaccine greater than or equal to 3yo with preservative IM

## 2012-09-09 ENCOUNTER — Encounter (HOSPITAL_COMMUNITY): Payer: Self-pay

## 2012-09-09 ENCOUNTER — Encounter (HOSPITAL_COMMUNITY)
Admission: RE | Admit: 2012-09-09 | Discharge: 2012-09-09 | Disposition: A | Discharge status: Home / Self Care | Payer: Medicare Other | Source: Ambulatory Visit | Attending: Orthopedic Surgery | Admitting: Orthopedic Surgery

## 2012-09-09 HISTORY — DX: Unspecified osteoarthritis, unspecified site: M19.90

## 2012-09-09 LAB — COMPREHENSIVE METABOLIC PANEL
ALT: 15 U/L (ref 0–53)
AST: 15 U/L (ref 0–37)
CO2: 33 mEq/L — ABNORMAL HIGH (ref 19–32)
Calcium: 9.6 mg/dL (ref 8.4–10.5)
Chloride: 102 mEq/L (ref 96–112)
GFR calc Af Amer: >90 mL/min (ref >90)
GFR calc non Af Amer: 83 mL/min — ABNORMAL LOW (ref >90)
Glucose, Bld: 126 mg/dL — ABNORMAL HIGH (ref 70–99)
Sodium: 140 mEq/L (ref 135–145)
Total Bilirubin: 0.4 mg/dL (ref 0.3–1.2)

## 2012-09-09 LAB — URINALYSIS, ROUTINE W REFLEX MICROSCOPIC
Bilirubin Urine: NEGATIVE
Glucose, UA: NEGATIVE mg/dL
Hgb urine dipstick: NEGATIVE
Ketones, ur: NEGATIVE mg/dL
Protein, ur: NEGATIVE mg/dL
pH: 5.5 (ref 5.0–8.0)

## 2012-09-09 LAB — CBC
Hemoglobin: 14.9 g/dL (ref 13.0–17.0)
MCH: 29.3 pg (ref 26.0–34.0)
MCV: 88.2 fL (ref 78.0–100.0)
Platelets: 195 10*3/uL (ref 150–400)
RBC: 5.09 MIL/uL (ref 4.22–5.81)
WBC: 5.7 10*3/uL (ref 4.0–10.5)

## 2012-09-09 LAB — SURGICAL PCR SCREEN: MRSA, PCR: NEGATIVE

## 2012-09-09 MED ORDER — CHLORHEXIDINE GLUCONATE 4 % EX LIQD
60.0000 mL | Freq: Once | CUTANEOUS | Status: DC
  Filled 2012-09-09: qty 60

## 2012-09-09 NOTE — Patient Instructions (Signed)
20 Nathan Navarro  09/09/2012   Your procedure is scheduled on: 09/16/12 AT 7:15 AM   Report to SHORT STAY DEPT  At 5:15  AM.  Call this number if you have problems the morning of surgery: [phone]   Remember:   Do not eat food or drink liquids AFTER MIDNIGHT    Take these medicines the morning of surgery with A SIP OF WATER: LEVOTHYROXIN / BYSTOLIC / CRESTOR   Do not wear jewelry, make-up or nail polish.  Do not wear lotions, powders, or perfumes.   Do not shave legs or underarms 48 hrs. before surgery (men may shave face)  Do not bring valuables to the hospital.  Contacts, dentures or bridgework may not be worn into surgery.  Leave suitcase in the car. After surgery it may be brought to your room.  For patients admitted to the hospital, checkout time is 11:00 AM the day of discharge.   Patients discharged the day of surgery will not be allowed to drive home. If going home same day of surgery, must have someone stay with you first 24 hrs at home and arrange for some one to drive you home from hospital.    Special Instructions:   Please read over the following fact sheets that you were given: MRSA  Information / Incentive Spirometer               SHOWER WITH BETASEPT THE NIGHT BEFORE SURGERY AND THE MORNING OF SURGERY          X_______________________________________________________________

## 2012-09-12 ENCOUNTER — Other Ambulatory Visit: Payer: Self-pay | Admitting: Orthopedic Surgery

## 2012-09-12 NOTE — H&P (Signed)
Nathan Navarro  DOB: 03/07/1941 Married / Language: English / Race: White Male  Date of Admission:  09/16/2012  Chief Complaint:  Right Knee Pain  History of Present Illness The patient is a 71 year old male who comes in for a preoperative History and Physical. The patient is scheduled for a right total knee arthroplasty to be performed by Dr. Frank V. Aluisio, MD at [facility] on 09/16/2012. The patient states that he is doing fairly well with the left total knee replacemet. They are currently on no medication (but does take Aleve occasionally) for their pain. He feels like he is slowly making some improvement with the therapy. Flexion has gotten a lot better. Extension is coming along but a little slower. He is now ready to proceed with the right total knee which has conitnued to bother him. They have been treated conservatively in the past for the above stated problem and despite conservative measures, they continue to have progressive pain and severe functional limitations and dysfunction. They have failed non-operative management including home exercise, medications, and injections. It is felt that they would benefit from undergoing total joint replacement. Risks and benefits of the procedure have been discussed with the patient and they elect to proceed with surgery. There are no active contraindications to surgery such as ongoing infection or rapidly progressive neurological disease.   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 01/12/2012 Morphine causes vomiting, but not an allergic reaction   Family History Congestive Heart Failure. mother Diabetes Mellitus. mother and sister Heart Disease. mother, father, sister and brother Heart disease in male family member before age 55 Hypertension. mother Osteoarthritis. mother   Social History Illicit drug use.  no Current work status. retired Children. 2 Pain Contract. no Living situation. live with spouse Tobacco use. Never smoker. never smoker Tobacco / smoke exposure. no Drug/Alcohol Rehab (Currently). no Alcohol use. current drinker; drinks beer, wine and hard liquor; only occasionally per week Exercise. Exercises daily; does running / walking, individual sport and gym / weights Drug/Alcohol Rehab (Previously). no Marital status. married Number of flights of stairs before winded. greater than 5   Medication History Lantus SoloStar (100UNIT/ML Solution, Subcutaneous) Active. Multivitamins ( Oral) Active. Fish Oil Burp-Less (1000MG Capsule, Oral) Active. Bystolic (5MG Tablet, Oral) Active. Synthroid (200MCG Tablet, Oral) Active. Crestor (40MG Tablet, Oral) Active. MetFORMIN HCl (1000MG Tablet, Oral) Active. Aspirin EC (325MG Tablet DR, Oral) Active. HumaLOG (100UNIT/ML Solution, Subcutaneous) Active.   Past Surgical History Arthroscopy of Knee. right Coronary Artery Bypass Graft. 4 or more vessels Spinal Surgery   Medical History Unspecified Diagnosis Diabetes Mellitus, Type II Coronary artery disease Impaired Hearing. uses hearing bilateral hearing aids  Review of Systems General:Not Present- Chills, Fever, Night Sweats, Fatigue, Weight Gain, Weight Loss and Memory Loss. Skin:Not Present- Hives, Itching, Rash, Eczema and Lesions. HEENT:Not Present- Tinnitus, Headache, Double Vision, Visual Loss, Hearing Loss and Dentures. Respiratory:Not Present- Shortness of breath with exertion, Shortness of breath at rest, Allergies, Coughing up blood and Chronic Cough. Cardiovascular:Not Present- Chest Pain, Racing/skipping heartbeats, Difficulty Breathing Lying Down, Murmur, Swelling and Palpitations. Gastrointestinal:Not Present- Bloody Stool, Heartburn, Abdominal Pain, Vomiting, Nausea, Constipation, Diarrhea, Difficulty Swallowing, Jaundice and Loss of  appetitie. Male Genitourinary:Not Present- Urinary frequency, Blood in Urine, Weak urinary stream, Discharge, Flank Pain, Incontinence, Painful Urination, Urgency, Urinary Retention and Urinating at Night. Musculoskeletal:Present- Muscle Pain and Joint Pain. Not Present- Muscle Weakness, Joint Swelling, Back Pain, Morning Stiffness   and Spasms. Neurological:Not Present- Tremor, Dizziness, Blackout spells, Paralysis, Difficulty with balance and Weakness. Psychiatric:Not Present- Insomnia.   Vitals Weight: 230 lb Height: 74 in Body Surface Area: 2.33 m Body Mass Index: 29.53 kg/m Pulse: 60 (Regular) Resp.: 12 (Unlabored) BP: 136/82 (Sitting, Right Arm, Standard)    Physical Exam The physical exam findings are as follows:   General Mental Status - Alert, cooperative and good historian. General Appearance- pleasant. Not in acute distress. Orientation- Oriented X3. Build & Nutrition- Well nourished and Well developed.   Head and Neck Head- normocephalic, atraumatic . Neck Global Assessment- supple. no bruit auscultated on the right and no bruit auscultated on the left.   Eye Pupil- Bilateral- Regular and Round. Motion- Bilateral- EOMI.   Chest and Lung Exam Auscultation: Breath sounds:- clear at anterior chest wall and - clear at posterior chest wall. Adventitious sounds:- No Adventitious sounds.   Cardiovascular Auscultation:Rhythm- Regular rate and rhythm. Heart Sounds- S1 WNL and S2 WNL. Murmurs & Other Heart Sounds:Auscultation of the heart reveals - No Murmurs.   Abdomen Palpation/Percussion:Tenderness- Abdomen is non-tender to palpation. Rigidity (guarding)- Abdomen is soft. Auscultation:Auscultation of the abdomen reveals - Bowel sounds normal.   Male Genitourinary Not done, not pertinent to present illness  Musculoskeletal  Right knee 0 to 125. No swelling. Slight tenderness medial greater than lateral. No  instability noted. There is moderate crepitus on range of motion. Pulse sensation motor intact both lower extremities. He has slow gait, but it is a steady gait pattern.  Assessment & Plan Osteoarthritis, Knee (715.96) Impression: Right Knee  Note: Patient is for a right total knee repalcement by Dr. Aluisio.  Plan is to go home.  PCP - Dr. Croitoru - Patient has been seen preoperatively and felt to be stable for surgery.  Signed electronically by DREW L PERKINS, PA-C  

## 2012-09-15 ENCOUNTER — Other Ambulatory Visit: Payer: Self-pay | Admitting: Orthopedic Surgery

## 2012-09-16 ENCOUNTER — Encounter (HOSPITAL_COMMUNITY): Payer: Self-pay | Admitting: *Deleted

## 2012-09-16 ENCOUNTER — Inpatient Hospital Stay (HOSPITAL_COMMUNITY)
Admission: RE | Admit: 2012-09-16 | Discharge: 2012-09-18 | DRG: 470 | Disposition: A | Discharge status: Home Health | Payer: Medicare Other | Source: Ambulatory Visit | Attending: Orthopedic Surgery | Admitting: Orthopedic Surgery

## 2012-09-16 ENCOUNTER — Encounter (HOSPITAL_COMMUNITY)
Admission: RE | Disposition: A | Discharge status: Home Health | Payer: Self-pay | Source: Ambulatory Visit | Attending: Orthopedic Surgery

## 2012-09-16 ENCOUNTER — Ambulatory Visit (HOSPITAL_COMMUNITY): Payer: Medicare Other | Admitting: Anesthesiology

## 2012-09-16 ENCOUNTER — Encounter (HOSPITAL_COMMUNITY): Payer: Self-pay | Admitting: Anesthesiology

## 2012-09-16 DIAGNOSIS — I2510 Atherosclerotic heart disease of native coronary artery without angina pectoris: Secondary | ICD-10-CM | POA: Diagnosis present

## 2012-09-16 DIAGNOSIS — Z951 Presence of aortocoronary bypass graft: Secondary | ICD-10-CM

## 2012-09-16 DIAGNOSIS — D62 Acute posthemorrhagic anemia: Secondary | ICD-10-CM

## 2012-09-16 DIAGNOSIS — M1710 Unilateral primary osteoarthritis, unspecified knee: Principal | ICD-10-CM | POA: Diagnosis present

## 2012-09-16 DIAGNOSIS — E119 Type 2 diabetes mellitus without complications: Secondary | ICD-10-CM | POA: Diagnosis present

## 2012-09-16 DIAGNOSIS — Z01812 Encounter for preprocedural laboratory examination: Secondary | ICD-10-CM

## 2012-09-16 DIAGNOSIS — E039 Hypothyroidism, unspecified: Secondary | ICD-10-CM | POA: Diagnosis present

## 2012-09-16 DIAGNOSIS — I10 Essential (primary) hypertension: Secondary | ICD-10-CM | POA: Diagnosis present

## 2012-09-16 DIAGNOSIS — Z96659 Presence of unspecified artificial knee joint: Secondary | ICD-10-CM

## 2012-09-16 HISTORY — PX: TOTAL KNEE ARTHROPLASTY: SHX125

## 2012-09-16 LAB — GLUCOSE, CAPILLARY
Glucose-Capillary: 126 mg/dL — ABNORMAL HIGH (ref 70–99)
Glucose-Capillary: 104 mg/dL — ABNORMAL HIGH (ref 70–99)

## 2012-09-16 LAB — TYPE AND SCREEN

## 2012-09-16 SURGERY — ARTHROPLASTY, KNEE, TOTAL
Anesthesia: Spinal | Site: Knee | Laterality: Right | Wound class: Clean

## 2012-09-16 MED ORDER — BUPIVACAINE IN DEXTROSE 0.75-8.25 % IT SOLN
INTRATHECAL | Status: DC | PRN
  Administered 2012-09-16: 2 mL via INTRATHECAL

## 2012-09-16 MED ORDER — FENTANYL CITRATE 0.05 MG/ML IJ SOLN
INTRAMUSCULAR | Status: DC | PRN
  Administered 2012-09-16: 50 ug via INTRAVENOUS

## 2012-09-16 MED ORDER — ACETAMINOPHEN 10 MG/ML IV SOLN
INTRAVENOUS | Status: AC
  Filled 2012-09-16: qty 100

## 2012-09-16 MED ORDER — CEFAZOLIN SODIUM-DEXTROSE 2-3 GM-% IV SOLR
INTRAVENOUS | Status: AC
  Filled 2012-09-16: qty 50

## 2012-09-16 MED ORDER — LACTATED RINGERS IV SOLN
INTRAVENOUS | Status: DC | PRN
  Administered 2012-09-16 (×4): via INTRAVENOUS

## 2012-09-16 MED ORDER — PROMETHAZINE HCL 25 MG/ML IJ SOLN: 6.2500 mg | INTRAMUSCULAR | Status: DC | PRN

## 2012-09-16 MED ORDER — ONDANSETRON HCL 4 MG/2ML IJ SOLN: 4.0000 mg | Freq: Four times a day (QID) | INTRAMUSCULAR | Status: DC | PRN

## 2012-09-16 MED ORDER — PHENYLEPHRINE HCL 10 MG/ML IJ SOLN
INTRAMUSCULAR | Status: DC | PRN
  Administered 2012-09-16: 80 ug via INTRAVENOUS

## 2012-09-16 MED ORDER — LACTATED RINGERS IV SOLN: INTRAVENOUS | Status: DC

## 2012-09-16 MED ORDER — ATORVASTATIN CALCIUM 40 MG PO TABS
40.0000 mg | ORAL_TABLET | Freq: Every day | ORAL | Status: DC
  Administered 2012-09-17: 40 mg via ORAL
  Filled 2012-09-16 (×2): qty 1

## 2012-09-16 MED ORDER — PROPOFOL 10 MG/ML IV BOLUS
INTRAVENOUS | Status: DC | PRN
  Administered 2012-09-16: 30 mg via INTRAVENOUS

## 2012-09-16 MED ORDER — NEBIVOLOL HCL 5 MG PO TABS
5.0000 mg | ORAL_TABLET | Freq: Every morning | ORAL | Status: DC
  Administered 2012-09-17 – 2012-09-18 (×2): 5 mg via ORAL
  Filled 2012-09-16 (×2): qty 1

## 2012-09-16 MED ORDER — MORPHINE SULFATE (PF) 1 MG/ML IV SOLN
INTRAVENOUS | Status: DC
  Administered 2012-09-16: 2 mg via INTRAVENOUS
  Administered 2012-09-16: 1 mg via INTRAVENOUS
  Administered 2012-09-16: 6 mg via INTRAVENOUS
  Administered 2012-09-17: 2 mg via INTRAVENOUS
  Administered 2012-09-17: 1 mg via INTRAVENOUS

## 2012-09-16 MED ORDER — MENTHOL 3 MG MT LOZG: 1.0000 | LOZENGE | OROMUCOSAL | Status: DC | PRN

## 2012-09-16 MED ORDER — DEXTROSE 5 % IV SOLN
3.0000 g | INTRAVENOUS | Status: AC
  Administered 2012-09-16: 2 g via INTRAVENOUS
  Filled 2012-09-16: qty 3000

## 2012-09-16 MED ORDER — METHOCARBAMOL 100 MG/ML IJ SOLN
500.0000 mg | Freq: Four times a day (QID) | INTRAVENOUS | Status: DC | PRN
  Filled 2012-09-16: qty 5

## 2012-09-16 MED ORDER — DIPHENHYDRAMINE HCL 12.5 MG/5ML PO ELIX: 12.5000 mg | ORAL_SOLUTION | Freq: Four times a day (QID) | ORAL | Status: DC | PRN

## 2012-09-16 MED ORDER — DIPHENHYDRAMINE HCL 50 MG/ML IJ SOLN: 12.5000 mg | Freq: Four times a day (QID) | INTRAMUSCULAR | Status: DC | PRN

## 2012-09-16 MED ORDER — RIVAROXABAN 10 MG PO TABS: 10.0000 mg | ORAL_TABLET | Freq: Every day | ORAL | Status: DC

## 2012-09-16 MED ORDER — METFORMIN HCL 500 MG PO TABS
1000.0000 mg | ORAL_TABLET | Freq: Two times a day (BID) | ORAL | Status: DC
  Filled 2012-09-16 (×4): qty 2

## 2012-09-16 MED ORDER — SODIUM CHLORIDE 0.9 % IV SOLN: INTRAVENOUS | Status: DC

## 2012-09-16 MED ORDER — ACETAMINOPHEN 10 MG/ML IV SOLN
1000.0000 mg | Freq: Four times a day (QID) | INTRAVENOUS | Status: AC
  Administered 2012-09-16 – 2012-09-17 (×4): 1000 mg via INTRAVENOUS
  Filled 2012-09-16 (×6): qty 100

## 2012-09-16 MED ORDER — ACETAMINOPHEN 325 MG PO TABS: 650.0000 mg | ORAL_TABLET | Freq: Four times a day (QID) | ORAL | Status: DC | PRN

## 2012-09-16 MED ORDER — INSULIN LISPRO 100 UNIT/ML ~~LOC~~ SOLN: 10.0000 [IU] | Freq: Every day | SUBCUTANEOUS | Status: DC

## 2012-09-16 MED ORDER — RIVAROXABAN 10 MG PO TABS
10.0000 mg | ORAL_TABLET | Freq: Every day | ORAL | Status: DC
  Administered 2012-09-17 – 2012-09-18 (×2): 10 mg via ORAL
  Filled 2012-09-16 (×3): qty 1

## 2012-09-16 MED ORDER — PHENOL 1.4 % MT LIQD: 1.0000 | OROMUCOSAL | Status: DC | PRN

## 2012-09-16 MED ORDER — HYDROMORPHONE HCL PF 1 MG/ML IJ SOLN: 0.2500 mg | INTRAMUSCULAR | Status: DC | PRN

## 2012-09-16 MED ORDER — ONDANSETRON HCL 4 MG PO TABS: 4.0000 mg | ORAL_TABLET | Freq: Four times a day (QID) | ORAL | Status: DC | PRN

## 2012-09-16 MED ORDER — METOCLOPRAMIDE HCL 5 MG/ML IJ SOLN: 5.0000 mg | Freq: Three times a day (TID) | INTRAMUSCULAR | Status: DC | PRN

## 2012-09-16 MED ORDER — INSULIN ASPART 100 UNIT/ML ~~LOC~~ SOLN
10.0000 [IU] | Freq: Every day | SUBCUTANEOUS | Status: DC
  Administered 2012-09-16 – 2012-09-17 (×2): 10 [IU] via SUBCUTANEOUS

## 2012-09-16 MED ORDER — FLEET ENEMA 7-19 GM/118ML RE ENEM: 1.0000 | ENEMA | Freq: Once | RECTAL | Status: AC | PRN

## 2012-09-16 MED ORDER — MORPHINE SULFATE (PF) 1 MG/ML IV SOLN
INTRAVENOUS | Status: AC
  Filled 2012-09-16: qty 25

## 2012-09-16 MED ORDER — ZOLPIDEM TARTRATE 5 MG PO TABS: 5.0000 mg | ORAL_TABLET | Freq: Every evening | ORAL | Status: DC | PRN

## 2012-09-16 MED ORDER — SODIUM CHLORIDE 0.9 % IV SOLN
INTRAVENOUS | Status: DC
  Administered 2012-09-16 – 2012-09-17 (×2): via INTRAVENOUS

## 2012-09-16 MED ORDER — ACETAMINOPHEN 650 MG RE SUPP: 650.0000 mg | Freq: Four times a day (QID) | RECTAL | Status: DC | PRN

## 2012-09-16 MED ORDER — TRAMADOL HCL 50 MG PO TABS
50.0000 mg | ORAL_TABLET | Freq: Four times a day (QID) | ORAL | Status: DC | PRN
  Administered 2012-09-18: 100 mg via ORAL
  Filled 2012-09-16: qty 2

## 2012-09-16 MED ORDER — 0.9 % SODIUM CHLORIDE (POUR BTL) OPTIME
TOPICAL | Status: DC | PRN
  Administered 2012-09-16: 1000 mL

## 2012-09-16 MED ORDER — METHOCARBAMOL 500 MG PO TABS
500.0000 mg | ORAL_TABLET | Freq: Four times a day (QID) | ORAL | Status: DC | PRN
  Administered 2012-09-17 – 2012-09-18 (×3): 500 mg via ORAL
  Filled 2012-09-16 (×3): qty 1

## 2012-09-16 MED ORDER — MIDAZOLAM HCL 5 MG/5ML IJ SOLN
INTRAMUSCULAR | Status: DC | PRN
  Administered 2012-09-16: 2 mg via INTRAVENOUS

## 2012-09-16 MED ORDER — BUPIVACAINE 0.25 % ON-Q PUMP SINGLE CATH 300ML
INJECTION | Status: AC
  Filled 2012-09-16: qty 300

## 2012-09-16 MED ORDER — NALOXONE HCL 0.4 MG/ML IJ SOLN: 0.4000 mg | INTRAMUSCULAR | Status: DC | PRN

## 2012-09-16 MED ORDER — BUPIVACAINE ON-Q PAIN PUMP (FOR ORDER SET NO CHG)
INJECTION | Status: DC
  Filled 2012-09-16: qty 1

## 2012-09-16 MED ORDER — BISACODYL 10 MG RE SUPP: 10.0000 mg | Freq: Every day | RECTAL | Status: DC | PRN

## 2012-09-16 MED ORDER — OXYCODONE HCL 5 MG PO TABS
5.0000 mg | ORAL_TABLET | ORAL | Status: DC | PRN
  Administered 2012-09-16 (×2): 5 mg via ORAL
  Administered 2012-09-17: 10 mg via ORAL
  Administered 2012-09-17 (×2): 15 mg via ORAL
  Filled 2012-09-16: qty 2
  Filled 2012-09-16: qty 1
  Filled 2012-09-16 (×3): qty 3
  Filled 2012-09-16: qty 1

## 2012-09-16 MED ORDER — SODIUM CHLORIDE 0.9 % IJ SOLN: 9.0000 mL | INTRAMUSCULAR | Status: DC | PRN

## 2012-09-16 MED ORDER — DOCUSATE SODIUM 100 MG PO CAPS
100.0000 mg | ORAL_CAPSULE | Freq: Two times a day (BID) | ORAL | Status: DC
  Administered 2012-09-16 – 2012-09-18 (×4): 100 mg via ORAL

## 2012-09-16 MED ORDER — METOCLOPRAMIDE HCL 10 MG PO TABS: 5.0000 mg | ORAL_TABLET | Freq: Three times a day (TID) | ORAL | Status: DC | PRN

## 2012-09-16 MED ORDER — ACETAMINOPHEN 10 MG/ML IV SOLN
1000.0000 mg | Freq: Once | INTRAVENOUS | Status: AC
  Administered 2012-09-16: 1000 mg via INTRAVENOUS

## 2012-09-16 MED ORDER — DIPHENHYDRAMINE HCL 12.5 MG/5ML PO ELIX: 12.5000 mg | ORAL_SOLUTION | ORAL | Status: DC | PRN

## 2012-09-16 MED ORDER — INSULIN GLARGINE 100 UNIT/ML ~~LOC~~ SOLN
40.0000 [IU] | Freq: Every day | SUBCUTANEOUS | Status: DC
  Administered 2012-09-17 – 2012-09-18 (×2): 40 [IU] via SUBCUTANEOUS

## 2012-09-16 MED ORDER — INSULIN ASPART 100 UNIT/ML ~~LOC~~ SOLN
0.0000 [IU] | Freq: Three times a day (TID) | SUBCUTANEOUS | Status: DC
  Administered 2012-09-16 – 2012-09-17 (×2): 3 [IU] via SUBCUTANEOUS
  Administered 2012-09-17: 2 [IU] via SUBCUTANEOUS
  Administered 2012-09-17 – 2012-09-18 (×2): 3 [IU] via SUBCUTANEOUS

## 2012-09-16 MED ORDER — PROPOFOL INFUSION 10 MG/ML OPTIME
INTRAVENOUS | Status: DC | PRN
  Administered 2012-09-16: 200 ug/kg/min via INTRAVENOUS

## 2012-09-16 MED ORDER — BUPIVACAINE 0.25 % ON-Q PUMP SINGLE CATH 300ML
INJECTION | Status: DC | PRN
  Administered 2012-09-16: 300 mL

## 2012-09-16 MED ORDER — LEVOTHYROXINE SODIUM 200 MCG PO TABS
200.0000 ug | ORAL_TABLET | Freq: Every day | ORAL | Status: DC
  Administered 2012-09-17 – 2012-09-18 (×2): 200 ug via ORAL
  Filled 2012-09-16 (×3): qty 1

## 2012-09-16 MED ORDER — POLYETHYLENE GLYCOL 3350 17 G PO PACK: 17.0000 g | PACK | Freq: Every day | ORAL | Status: DC | PRN

## 2012-09-16 MED ORDER — CEFAZOLIN SODIUM-DEXTROSE 2-3 GM-% IV SOLR
2.0000 g | Freq: Four times a day (QID) | INTRAVENOUS | Status: AC
  Administered 2012-09-16 (×2): 2 g via INTRAVENOUS
  Filled 2012-09-16 (×2): qty 50

## 2012-09-16 SURGICAL SUPPLY — 52 items
BAG ZIPLOCK 12X15 (MISCELLANEOUS) ×2 IMPLANT
BANDAGE ELASTIC 6 VELCRO ST LF (GAUZE/BANDAGES/DRESSINGS) ×2 IMPLANT
BANDAGE ESMARK 6X9 LF (GAUZE/BANDAGES/DRESSINGS) ×1 IMPLANT
BLADE SAG 18X100X1.27 (BLADE) ×2 IMPLANT
BLADE SAW SGTL 11.0X1.19X90.0M (BLADE) ×2 IMPLANT
BNDG ESMARK 6X9 LF (GAUZE/BANDAGES/DRESSINGS) ×2
BOWL SMART MIX CTS (DISPOSABLE) ×2 IMPLANT
CATH KIT ON-Q SILVERSOAK 5IN (CATHETERS) ×2 IMPLANT
CEMENT HV SMART SET (Cement) ×2 IMPLANT
CLOTH BEACON ORANGE TIMEOUT ST (SAFETY) ×2 IMPLANT
CLSR STERI-STRIP ANTIMIC 1/2X4 (GAUZE/BANDAGES/DRESSINGS) ×2 IMPLANT
CUFF TOURN SGL QUICK 34 (TOURNIQUET CUFF) ×1
CUFF TRNQT CYL 34X4X40X1 (TOURNIQUET CUFF) ×1 IMPLANT
DRAPE EXTREMITY T 121X128X90 (DRAPE) ×2 IMPLANT
DRAPE POUCH INSTRU U-SHP 10X18 (DRAPES) ×2 IMPLANT
DRAPE U-SHAPE 47X51 STRL (DRAPES) ×2 IMPLANT
DRSG ADAPTIC 3X8 NADH LF (GAUZE/BANDAGES/DRESSINGS) ×2 IMPLANT
DRSG PAD ABDOMINAL 8X10 ST (GAUZE/BANDAGES/DRESSINGS) ×2 IMPLANT
DURAPREP 26ML APPLICATOR (WOUND CARE) ×2 IMPLANT
ELECT REM PT RETURN 9FT ADLT (ELECTROSURGICAL) ×2
ELECTRODE REM PT RTRN 9FT ADLT (ELECTROSURGICAL) ×1 IMPLANT
EVACUATOR 1/8 PVC DRAIN (DRAIN) ×2 IMPLANT
FACESHIELD LNG OPTICON STERILE (SAFETY) ×10 IMPLANT
GLOVE BIO SURGEON STRL SZ8 (GLOVE) ×2 IMPLANT
GLOVE BIOGEL PI IND STRL 8 (GLOVE) ×2 IMPLANT
GLOVE BIOGEL PI INDICATOR 8 (GLOVE) ×2
GLOVE ECLIPSE 8.0 STRL XLNG CF (GLOVE) ×4 IMPLANT
GLOVE SURG SS PI 6.5 STRL IVOR (GLOVE) IMPLANT
GOWN STRL NON-REIN LRG LVL3 (GOWN DISPOSABLE) ×4 IMPLANT
GOWN STRL REIN XL XLG (GOWN DISPOSABLE) ×2 IMPLANT
HANDPIECE INTERPULSE COAX TIP (DISPOSABLE) ×1
IMMOBILIZER KNEE 20 (SOFTGOODS) ×2
IMMOBILIZER KNEE 20 THIGH 36 (SOFTGOODS) ×1 IMPLANT
KIT BASIN OR (CUSTOM PROCEDURE TRAY) ×2 IMPLANT
MANIFOLD NEPTUNE II (INSTRUMENTS) ×2 IMPLANT
NS IRRIG 1000ML POUR BTL (IV SOLUTION) ×2 IMPLANT
PACK TOTAL JOINT (CUSTOM PROCEDURE TRAY) ×2 IMPLANT
PAD ABD 7.5X8 STRL (GAUZE/BANDAGES/DRESSINGS) ×2 IMPLANT
PADDING CAST COTTON 6X4 STRL (CAST SUPPLIES) ×2 IMPLANT
POSITIONER SURGICAL ARM (MISCELLANEOUS) ×2 IMPLANT
SET HNDPC FAN SPRY TIP SCT (DISPOSABLE) ×1 IMPLANT
SPONGE GAUZE 4X4 12PLY (GAUZE/BANDAGES/DRESSINGS) ×2 IMPLANT
STRIP CLOSURE SKIN 1/2X4 (GAUZE/BANDAGES/DRESSINGS) ×4 IMPLANT
SUCTION FRAZIER 12FR DISP (SUCTIONS) ×2 IMPLANT
SUT MNCRL AB 4-0 PS2 18 (SUTURE) ×2 IMPLANT
SUT VIC AB 2-0 CT1 27 (SUTURE) ×3
SUT VIC AB 2-0 CT1 TAPERPNT 27 (SUTURE) ×3 IMPLANT
SUT VLOC 180 0 24IN GS25 (SUTURE) ×2 IMPLANT
TOWEL OR 17X26 10 PK STRL BLUE (TOWEL DISPOSABLE) ×4 IMPLANT
TRAY FOLEY CATH 14FRSI W/METER (CATHETERS) ×2 IMPLANT
WATER STERILE IRR 1500ML POUR (IV SOLUTION) ×2 IMPLANT
WRAP KNEE MAXI GEL POST OP (GAUZE/BANDAGES/DRESSINGS) ×4 IMPLANT

## 2012-09-16 NOTE — Interval H&P Note (Signed)
History and Physical Interval Note:  09/16/2012 6:58 AM  Nathan Navarro  has presented today for surgery, with the diagnosis of osteoarthritis right knee   The various methods of treatment have been discussed with the patient and family. After consideration of risks, benefits and other options for treatment, the patient has consented to  Procedure(s) (LRB) with comments: TOTAL KNEE ARTHROPLASTY (Right) as a surgical intervention .  The patient's history has been reviewed, patient examined, no change in status, stable for surgery.  I have reviewed the patient's chart and labs.  Questions were answered to the patient's satisfaction.     Loanne Drilling

## 2012-09-16 NOTE — Progress Notes (Signed)
Dr Lequita Halt stated that Nathan Navarro may have his home insulin humalog substituted by hospital novalog.

## 2012-09-16 NOTE — Plan of Care (Signed)
Problem: Consults Goal: Diabetes Guidelines if Diabetic/Glucose > 140 If diabetic or lab glucose is > 140 mg/dl - Initiate Diabetes/Hyperglycemia Guidelines & Document Interventions  Outcome: Not Applicable Date Met:  09/16/12 Pt established diabetic. No further Ed required at this time

## 2012-09-16 NOTE — Progress Notes (Signed)
UR COMPLETED  

## 2012-09-16 NOTE — Op Note (Signed)
Pre-operative diagnosis- Osteoarthritis  Right knee(s)  Post-operative diagnosis- Osteoarthritis Right knee(s)  Procedure-  Right  Total Knee Arthroplasty  Surgeon- Gus Rankin. Lizette Pazos, MD  Assistant- Leilani Able, PA-C   Anesthesia-  Spinal EBL-* No blood loss amount entered *  Drains Hemovac  Tourniquet time-  Total Tourniquet Time Documented: Thigh (Right) - 37 minutes   Complications- None  Condition-PACU - hemodynamically stable.   Brief Clinical Note  Nathan Navarro is a 71 y.o. year old male with end stage OA of his right knee with progressively worsening pain and dysfunction. He has constant pain, with activity and at rest and significant functional deficits with difficulties even with ADLs. He has had extensive non-op management including analgesics, injections of cortisone and viscosupplements, and home exercise program, but remains in significant pain with significant dysfunction. Radiographs show bone on bone arthritis medial and patellofemoral compartments. He presents now for right Total Knee Arthroplasty.    Procedure in detail---   The patient is brought into the operating room and positioned supine on the operating table. After successful administration of  Spinal,   a tourniquet is placed high on the  Right thigh(s) and the lower extremity is prepped and draped in the usual sterile fashion. Time out is performed by the operating team and then the  Right lower extremity is wrapped in Esmarch, knee flexed and the tourniquet inflated to 300 mmHg.       A midline incision is made with a ten blade through the subcutaneous tissue to the level of the extensor mechanism. A fresh blade is used to make a medial parapatellar arthrotomy. Soft tissue over the proximal medial tibia is subperiosteally elevated to the joint line with a knife and into the semimembranosus bursa with a Cobb elevator. Soft tissue over the proximal lateral tibia is elevated with attention being paid to avoiding  the patellar tendon on the tibial tubercle. The patella is everted, knee flexed 90 degrees and the ACL and PCL are removed. Findings are bone on bone medial and patellofemoral with large medial osteophytes.        The drill is used to create a starting hole in the distal femur and the canal is thoroughly irrigated with sterile saline to remove the fatty contents. The 5 degree Right  valgus alignment guide is placed into the femoral canal and the distal femoral cutting block is pinned to remove 10 mm off the distal femur. Resection is made with an oscillating saw.      The tibia is subluxed forward and the menisci are removed. The extramedullary alignment guide is placed referencing proximally at the medial aspect of the tibial tubercle and distally along the second metatarsal axis and tibial crest. The block is pinned to remove 2mm off the more deficient medial  side. Resection is made with an oscillating saw. Size 4is the most appropriate size for the tibia and the proximal tibia is prepared with the modular drill and keel punch for that size.      The femoral sizing guide is placed and size 5 is most appropriate. Rotation is marked off the epicondylar axis and confirmed by creating a rectangular flexion gap at 90 degrees. The size 5 cutting block is pinned in this rotation and the anterior, posterior and chamfer cuts are made with the oscillating saw. The intercondylar block is then placed and that cut is made.      Trial size 4 tibial component, trial size 5 posterior stabilized femur and a 10  mm posterior stabilized rotating platform insert trial is placed. Full extension is achieved with excellent varus/valgus and anterior/posterior balance throughout full range of motion. The patella is everted and thickness measured to be 27  mm. Free hand resection is taken to 15 mm, a 41 template is placed, lug holes are drilled, trial patella is placed, and it tracks normally. Osteophytes are removed off the posterior  femur with the trial in place. All trials are removed and the cut bone surfaces prepared with pulsatile lavage. Cement is mixed and once ready for implantation, the size 4 tibial implant, size  5 posterior stabilized femoral component, and the size 41 patella are cemented in place and the patella is held with the clamp. The trial insert is placed and the knee held in full extension. All extruded cement is removed and once the cement is hard the permanent 10 mm posterior stabilized rotating platform insert is placed into the tibial tray.      The wound is copiously irrigated with saline solution and the extensor mechanism closed over a hemovac drain with #1 PDS suture. The tourniquet is released for a total tourniquet time of 37  minutes. Flexion against gravity is 140 degrees and the patella tracks normally. Subcutaneous tissue is closed with 2.0 vicryl and subcuticular with running 4.0 Monocryl. The catheter for the Marcaine pain pump is placed and the pump is initiated. The incision is cleaned and dried and steri-strips and a bulky sterile dressing are applied. The limb is placed into a knee immobilizer and the patient is awakened and transported to recovery in stable condition.      Please note that a surgical assistant was a medical necessity for this procedure in order to perform it in a safe and expeditious manner. Surgical assistant was necessary to retract the ligaments and vital neurovascular structures to prevent injury to them and also necessary for proper positioning of the limb to allow for anatomic placement of the prosthesis.   Gus Rankin Jeimy Bickert, MD    09/16/2012, 8:12 AM

## 2012-09-16 NOTE — Anesthesia Postprocedure Evaluation (Signed)
Anesthesia Post Note  Patient: Nathan Navarro  Procedure(s) Performed: Procedure(s) (LRB): TOTAL KNEE ARTHROPLASTY (Right)  Anesthesia type: Spinal  Patient location: PACU  Post pain: Pain level controlled  Post assessment: Post-op Vital signs reviewed  Last Vitals:  Filed Vitals:   09/16/12 0843  BP:   Pulse:   Temp: 36.4 C  Resp:     Post vital signs: Reviewed  Level of consciousness: sedated  Complications: No apparent anesthesia complications

## 2012-09-16 NOTE — Transfer of Care (Signed)
Immediate Anesthesia Transfer of Care Note  Patient: Nathan Navarro  Procedure(s) Performed: Procedure(s) (LRB) with comments: TOTAL KNEE ARTHROPLASTY (Right)  Patient Location: PACU  Anesthesia Type: Regional and Spinal  Level of Consciousness: awake, alert , sedated and patient cooperative  Airway & Oxygen Therapy: Patient Spontanous Breathing and Patient connected to face mask oxygen  Post-op Assessment: Report given to PACU RN and Post -op Vital signs reviewed and stable  Post vital signs: Reviewed and stable  Complications: No apparent anesthesia complications

## 2012-09-16 NOTE — Anesthesia Procedure Notes (Signed)
Spinal  Patient location during procedure: OR Start time: 09/16/2012 7:15 AM End time: 09/16/2012 7:20 AM Staffing Anesthesiologist: Lucille Passy F Performed by: anesthesiologist  Preanesthetic Checklist Completed: patient identified, site marked, surgical consent, pre-op evaluation, timeout performed, IV checked, risks and benefits discussed and monitors and equipment checked Spinal Block Patient position: sitting Prep: Betadine Patient monitoring: heart rate, continuous pulse ox and blood pressure Approach: midline Location: L3-4 Injection technique: single-shot Needle Needle type: Quincke  Needle gauge: 22 G Needle length: 9 cm Additional Notes Expiration date of kit checked and confirmed. Patient tolerated procedure well, without complications. Negative heme/paresthesia

## 2012-09-16 NOTE — H&P (View-Only) (Signed)
Nathan Navarro  DOB: 03-18-1941 Married / Language: English / Race: White Male  Date of Admission:  09/16/2012  Chief Complaint:  Right Knee Pain  History of Present Illness The patient is a 71 year old male who comes in for a preoperative History and Physical. The patient is scheduled for a right total knee arthroplasty to be performed by Dr. Gus Rankin. Aluisio, MD at [facility] on 09/16/2012. The patient states that he is doing fairly well with the left total knee replacemet. They are currently on no medication (but does take Aleve occasionally) for their pain. He feels like he is slowly making some improvement with the therapy. Flexion has gotten a lot better. Extension is coming along but a little slower. He is now ready to proceed with the right total knee which has conitnued to bother him. They have been treated conservatively in the past for the above stated problem and despite conservative measures, they continue to have progressive pain and severe functional limitations and dysfunction. They have failed non-operative management including home exercise, medications, and injections. It is felt that they would benefit from undergoing total joint replacement. Risks and benefits of the procedure have been discussed with the patient and they elect to proceed with surgery. There are no active contraindications to surgery such as ongoing infection or rapidly progressive neurological disease.   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 01/12/2012 Morphine causes vomiting, but not an allergic reaction   Family History Congestive Heart Failure. mother Diabetes Mellitus. mother and sister Heart Disease. mother, father, sister and brother Heart disease in male family member before age 67 Hypertension. mother Osteoarthritis. mother   Social History Illicit drug use.  no Current work status. retired Copywriter, advertising. 2 Pain Contract. no Living situation. live with spouse Tobacco use. Never smoker. never smoker Tobacco / smoke exposure. no Drug/Alcohol Rehab (Currently). no Alcohol use. current drinker; drinks beer, wine and hard liquor; only occasionally per week Exercise. Exercises daily; does running / walking, individual sport and gym / weights Drug/Alcohol Rehab (Previously). no Marital status. married Number of flights of stairs before winded. greater than 5   Medication History Lantus SoloStar (100UNIT/ML Solution, Subcutaneous) Active. Multivitamins ( Oral) Active. Fish Oil Burp-Less (1000MG  Capsule, Oral) Active. Bystolic (5MG  Tablet, Oral) Active. Synthroid ( Tablet, Oral) Active. Crestor (40MG  Tablet, Oral) Active. MetFORMIN HCl (1000MG  Tablet, Oral) Active. Aspirin EC (325MG  Tablet DR, Oral) Active. HumaLOG (100UNIT/ML Solution, Subcutaneous) Active.   Past Surgical History Arthroscopy of Knee. right Coronary Artery Bypass Graft. 4 or more vessels Spinal Surgery   Medical History Unspecified Diagnosis Diabetes Mellitus, Type II Coronary artery disease Impaired Hearing. uses hearing bilateral hearing aids  Review of Systems General:Not Present- Chills, Fever, Night Sweats, Fatigue, Weight Gain, Weight Loss and Memory Loss. Skin:Not Present- Hives, Itching, Rash, Eczema and Lesions. HEENT:Not Present- Tinnitus, Headache, Double Vision, Visual Loss, Hearing Loss and Dentures. Respiratory:Not Present- Shortness of breath with exertion, Shortness of breath at rest, Allergies, Coughing up blood and Chronic Cough. Cardiovascular:Not Present- Chest Pain, Racing/skipping heartbeats, Difficulty Breathing Lying Down, Murmur, Swelling and Palpitations. Gastrointestinal:Not Present- Bloody Stool, Heartburn, Abdominal Pain, Vomiting, Nausea, Constipation, Diarrhea, Difficulty Swallowing, Jaundice and Loss of  appetitie. Male Genitourinary:Not Present- Urinary frequency, Blood in Urine, Weak urinary stream, Discharge, Flank Pain, Incontinence, Painful Urination, Urgency, Urinary Retention and Urinating at Night. Musculoskeletal:Present- Muscle Pain and Joint Pain. Not Present- Muscle Weakness, Joint Swelling, Back Pain, Morning Stiffness  and Spasms. Neurological:Not Present- Tremor, Dizziness, Blackout spells, Paralysis, Difficulty with balance and Weakness. Psychiatric:Not Present- Insomnia.   Vitals Weight: 230 lb Height: 74 in Body Surface Area: 2.33 m Body Mass Index: 29.53 kg/m Pulse: 60 (Regular) Resp.: 12 (Unlabored) BP: 136/82 (Sitting, Right Arm, Standard)    Physical Exam The physical exam findings are as follows:   General Mental Status - Alert, cooperative and good historian. General Appearance- pleasant. Not in acute distress. Orientation- Oriented X3. Build & Nutrition- Well nourished and Well developed.   Head and Neck Head- normocephalic, atraumatic . Neck Global Assessment- supple. no bruit auscultated on the right and no bruit auscultated on the left.   Eye Pupil- Bilateral- Regular and Round. Motion- Bilateral- EOMI.   Chest and Lung Exam Auscultation: Breath sounds:- clear at anterior chest wall and - clear at posterior chest wall. Adventitious sounds:- No Adventitious sounds.   Cardiovascular Auscultation:Rhythm- Regular rate and rhythm. Heart Sounds- S1 WNL and S2 WNL. Murmurs & Other Heart Sounds:Auscultation of the heart reveals - No Murmurs.   Abdomen Palpation/Percussion:Tenderness- Abdomen is non-tender to palpation. Rigidity (guarding)- Abdomen is soft. Auscultation:Auscultation of the abdomen reveals - Bowel sounds normal.   Male Genitourinary Not done, not pertinent to present illness  Musculoskeletal  Right knee 0 to 125. No swelling. Slight tenderness medial greater than lateral. No  instability noted. There is moderate crepitus on range of motion. Pulse sensation motor intact both lower extremities. He has slow gait, but it is a steady gait pattern.  Assessment & Plan Osteoarthritis, Knee (715.96) Impression: Right Knee  Note: Patient is for a right total knee repalcement by Dr. Lequita Halt.  Plan is to go home.  PCP - Dr. Royann Shivers - Patient has been seen preoperatively and felt to be stable for surgery.  Signed electronically by Roberts Gaudy, PA-C

## 2012-09-16 NOTE — Anesthesia Preprocedure Evaluation (Signed)
Anesthesia Evaluation  Patient identified by MRN, date of birth, ID band Patient awake    Reviewed: Allergy & Precautions, H&P , NPO status , Patient's Chart, lab work & pertinent test results  Airway Mallampati: II TM Distance: >3 FB Neck ROM: Full    Dental No notable dental hx. (+) Teeth Intact and Dental Advisory Given   Pulmonary neg pulmonary ROS, sleep apnea (mild, no CPAP) ,  breath sounds clear to auscultation  Pulmonary exam normal       Cardiovascular hypertension, Pt. on medications and Pt. on home beta blockers + CAD, + CABG (CABG x 3; followed by CABG x 4; denies cardiac symptoms) and +CHF Rhythm:Regular Rate:Normal     Neuro/Psych negative neurological ROS  negative psych ROS   GI/Hepatic negative GI ROS, Neg liver ROS,   Endo/Other  diabetes, Type 2, Insulin Dependent and Oral Hypoglycemic AgentsHypothyroidism Morbid obesity  Renal/GU negative Renal ROS  negative genitourinary   Musculoskeletal negative musculoskeletal ROS (+)   Abdominal   Peds negative pediatric ROS (+)  Hematology negative hematology ROS (+)   Anesthesia Other Findings   Reproductive/Obstetrics negative OB ROS                           Anesthesia Physical Anesthesia Plan  ASA: III  Anesthesia Plan: Spinal   Post-op Pain Management:    Induction:   Airway Management Planned: Simple Face Mask  Additional Equipment:   Intra-op Plan:   Post-operative Plan:   Informed Consent: I have reviewed the patients History and Physical, chart, labs and discussed the procedure including the risks, benefits and alternatives for the proposed anesthesia with the patient or authorized representative who has indicated his/her understanding and acceptance.   Dental advisory given  Plan Discussed with: CRNA  Anesthesia Plan Comments:         Anesthesia Quick Evaluation

## 2012-09-17 ENCOUNTER — Encounter (HOSPITAL_COMMUNITY): Payer: Self-pay | Admitting: Orthopedic Surgery

## 2012-09-17 DIAGNOSIS — D62 Acute posthemorrhagic anemia: Secondary | ICD-10-CM

## 2012-09-17 LAB — CBC
HCT: 36.2 % — ABNORMAL LOW (ref 39.0–52.0)
Hemoglobin: 12 g/dL — ABNORMAL LOW (ref 13.0–17.0)
MCH: 29.1 pg (ref 26.0–34.0)
MCHC: 33.1 g/dL (ref 30.0–36.0)
MCV: 87.9 fL (ref 78.0–100.0)
Platelets: 167 10*3/uL (ref 150–400)
RBC: 4.12 MIL/uL — ABNORMAL LOW (ref 4.22–5.81)
RDW: 14.9 % (ref 11.5–15.5)
WBC: 7.5 10*3/uL (ref 4.0–10.5)

## 2012-09-17 LAB — GLUCOSE, CAPILLARY
Glucose-Capillary: 181 mg/dL — ABNORMAL HIGH (ref 70–99)
Glucose-Capillary: 154 mg/dL — ABNORMAL HIGH (ref 70–99)

## 2012-09-17 LAB — BASIC METABOLIC PANEL WITH GFR
BUN: 12 mg/dL (ref 6–23)
CO2: 30 meq/L (ref 19–32)
Calcium: 8.5 mg/dL (ref 8.4–10.5)
Chloride: 103 meq/L (ref 96–112)
Creatinine, Ser: 0.94 mg/dL (ref 0.50–1.35)
GFR calc Af Amer: >90 mL/min
GFR calc non Af Amer: 83 mL/min — ABNORMAL LOW
Glucose, Bld: 172 mg/dL — ABNORMAL HIGH (ref 70–99)
Potassium: 4.3 meq/L (ref 3.5–5.1)
Sodium: 138 meq/L (ref 135–145)

## 2012-09-17 MED ORDER — MORPHINE SULFATE 2 MG/ML IJ SOLN: 1.0000 mg | INTRAMUSCULAR | Status: DC | PRN

## 2012-09-17 NOTE — Evaluation (Signed)
Occupational Therapy Evaluation Patient Details Name: Lancelot Alyea MRN: [id number] DOB: 02-22-1941 Today's Date: 09/17/2012 Time: 4098-1191 OT Time Calculation (min): 13 min  OT Assessment / Plan / Recommendation Clinical Impression  Pt states this is his 3rd knee sx and feels comfortable with transferring on/off toilet and in/out of shower. Pt did express desire to learn how to use sock aid. Was educated with good return demo. No further acute OT needs noted.    OT Assessment  Patient does not need any further OT services    Follow Up Recommendations  No OT follow up    Barriers to Discharge      Equipment Recommendations  None recommended by OT    Recommendations for Other Services    Frequency       Precautions / Restrictions Precautions Precautions: Knee Required Braces or Orthoses: Knee Immobilizer - Right Knee Immobilizer - Right: Discontinue once straight leg raise with < 10 degree lag Restrictions Weight Bearing Restrictions: No   Pertinent Vitals/Pain Reported mild R knee pain at end of session. Re-positioned and cold applied.    ADL  ADL Comments: Pt verbalized that the only thing he wished to work on was donning/doffing socks. Educated pt how to utilize sock aid for socks and ted hose with good return demo.    OT Diagnosis:    OT Problem List:   OT Treatment Interventions:     OT Goals    Visit Information  Last OT Received On: 09/17/12 Assistance Needed: +1    Subjective Data  Subjective: I had a hard time putting my sock on last time. Patient Stated Goal: Not asked   Prior Functioning  Vision/Perception  Home Living Lives With: Spouse Available Help at Discharge: Family Type of Home: House Home Access: Stairs to enter Secretary/administrator of Steps: 1 Home Layout: One level Bathroom Shower/Tub: Health visitor: Handicapped height Home Adaptive Equipment: Grab bars around toilet;Straight cane;Walker - rolling;Bedside  commode/3-in-1 Prior Function Level of Independence: Independent Able to Take Stairs?: Yes Driving: Yes Communication Communication: No difficulties Dominant Hand: Right      Cognition  Overall Cognitive Status: Appears within functional limits for tasks assessed/performed Arousal/Alertness: Awake/alert Orientation Level: Appears intact for tasks assessed Behavior During Session: [facility] for tasks performed    Extremity/Trunk Assessment Right Upper Extremity Assessment RUE ROM/Strength/Tone: [facility] for tasks assessed Left Upper Extremity Assessment LUE ROM/Strength/Tone: [facility] for tasks assessed   Mobility  Shoulder Instructions          Exercise     Balance     End of Session OT - End of Session Activity Tolerance: Patient tolerated treatment well Patient left: in chair;with call bell/phone within reach  GO     Alanie Syler A OTR/L [phone] 09/17/2012, 11:45 AM

## 2012-09-17 NOTE — Evaluation (Signed)
Physical Therapy Evaluation Patient Details Name: Nathan Navarro MRN: [id number] DOB: [birth year] Today's Date: 09/17/2012 Time: 4098-1191 PT Time Calculation (min): 33 min  PT Assessment / Plan / Recommendation Clinical Impression  Pt s/p R TKR presents with decreased ROM BIl knees and decreased R LE strength limiting functional mobility    PT Assessment  Patient needs continued PT services    Follow Up Recommendations  Home health PT    Barriers to Discharge        Equipment Recommendations  None recommended by OT    Recommendations for Other Services     Frequency 7X/week    Precautions / Restrictions Precautions Precautions: Knee Required Braces or Orthoses: Knee Immobilizer - Right Knee Immobilizer - Right: Discontinue once straight leg raise with < 10 degree lag Restrictions Weight Bearing Restrictions: No Other Position/Activity Restrictions: WBAT   Pertinent Vitals/Pain 5/10; premedicated, cold packs provided      Mobility  Bed Mobility Bed Mobility: Sit to Supine;Supine to Sit Supine to Sit: 4: Min assist Details for Bed Mobility Assistance: min assist with R LE; cues for sequence Transfers Transfers: Sit to Stand;Stand to Sit Sit to Stand: 4: Min assist;From elevated surface Stand to Sit: 4: Min assist;3: Mod assist;To chair/3-in-1;With upper extremity assist;With armrests Details for Transfer Assistance: cues for use of UEs and to walk legs fwd to sit Ambulation/Gait Ambulation/Gait Assistance: 4: Min assist Ambulation Distance (Feet): 60 Feet Assistive device: Rolling walker Ambulation/Gait Assistance Details: cues for posture, sequence and position from RW Gait Pattern: Step-to pattern    Exercises Total Joint Exercises Ankle Circles/Pumps: AROM;10 reps;Supine;Both Quad Sets: AROM;Both;10 reps;Supine Heel Slides: AAROM;10 reps;Supine;Right Straight Leg Raises: AAROM;10 reps;Right;Supine   PT Diagnosis: Difficulty walking  PT Problem List:  Decreased strength;Decreased range of motion;Decreased activity tolerance;Decreased mobility;Decreased knowledge of use of DME;Pain PT Treatment Interventions: DME instruction;Functional mobility training;Stair training;Gait training;Therapeutic activities;Therapeutic exercise;Patient/family education   PT Goals Acute Rehab PT Goals PT Goal Formulation: With patient Time For Goal Achievement: 09/21/12 Potential to Achieve Goals: Good Pt will go Supine/Side to Sit: with supervision PT Goal: Supine/Side to Sit - Progress: Goal set today Pt will go Sit to Supine/Side: with supervision PT Goal: Sit to Supine/Side - Progress: Goal set today Pt will go Sit to Stand: with supervision PT Goal: Sit to Stand - Progress: Goal set today Pt will go Stand to Sit: with supervision PT Goal: Stand to Sit - Progress: Goal set today Pt will Ambulate: >150 feet;with supervision;with rolling walker PT Goal: Ambulate - Progress: Goal set today Pt will Go Up / Down Stairs: 1-2 stairs;with min assist;with rolling walker PT Goal: Up/Down Stairs - Progress: Goal set today  Visit Information  Last PT Received On: 09/17/12 Assistance Needed: +1    Subjective Data  Subjective: I had the other one done and it never did bend righ.  If I work it I can get it to bend about 90 Patient Stated Goal: Get this knee bending better than the first one   Prior Functioning  Home Living Lives With: Spouse Available Help at Discharge: Family Type of Home: House Home Access: Stairs to enter Secretary/administrator of Steps: 1 Entrance Stairs-Rails: None Home Layout: One level Bathroom Shower/Tub: Health visitor: Handicapped height Home Adaptive Equipment: Grab bars around toilet;Straight cane;Walker - rolling;Bedside commode/3-in-1 Prior Function Level of Independence: Independent Able to Take Stairs?: Yes Driving: Yes Vocation: Retired Musician: No difficulties Dominant Hand:  Right    Cognition  Overall Cognitive Status: Appears within  functional limits for tasks assessed/performed Arousal/Alertness: Awake/alert Orientation Level: Appears intact for tasks assessed Behavior During Session: [facility] for tasks performed    Extremity/Trunk Assessment Right Upper Extremity Assessment RUE ROM/Strength/Tone: [facility] for tasks assessed Left Upper Extremity Assessment LUE ROM/Strength/Tone: WFL for tasks assessed Right Lower Extremity Assessment RLE ROM/Strength/Tone: Deficits RLE ROM/Strength/Tone Deficits: 2+/5 quads, AAROM at knee -10 - 35 Left Lower Extremity Assessment LLE ROM/Strength/Tone: Deficits LLE ROM/Strength/Tone Deficits: strength WFL; AROM at knee to 40   Balance    End of Session PT - End of Session Equipment Utilized During Treatment: Right knee immobilizer Activity Tolerance: Patient tolerated treatment well Patient left: in chair;with call bell/phone within reach Nurse Communication: Mobility status  GP     Nathan Navarro 09/17/2012, 12:27 PM

## 2012-09-17 NOTE — Progress Notes (Signed)
   Subjective: 1 Day Post-Op Procedure(s) (LRB): TOTAL KNEE ARTHROPLASTY (Right) Patient reports pain as mild.   Patient seen in rounds with Dr. Lequita Halt. Patient is well, and has had no acute complaints or problems We will start therapy today.  Plan is to go Home after hospital stay.  Objective: Vital signs in last 24 hours: Temp:  [97 F (36.1 C)-98.5 F (36.9 C)] 98.5 F (36.9 C) (09/24 0542) Pulse Rate:  [40-69] 61  (09/24 0542) Resp:  [9-18] 14  (09/24 0542) BP: (120-167)/(62-90) 151/82 mmHg (09/24 0542) SpO2:  [94 %-100 %] 94 % (09/24 0542) Weight:  [108.863 kg (240 lb)] 108.863 kg (240 lb) (09/23 1015)  Intake/Output from previous day:  Intake/Output Summary (Last 24 hours) at 09/17/12 0752 Last data filed at 09/17/12 0552  Gross per 24 hour  Intake 4247.67 ml  Output   5335 ml  Net -1087.33 ml    Intake/Output this shift:    Labs:  Basename 09/17/12 0414  HGB 12.0*    Basename 09/17/12 0414  WBC 7.5  RBC 4.12*  HCT 36.2*  PLT 167    Basename 09/17/12 0414  NA 138  K 4.3  CL 103  CO2 30  BUN 12  CREATININE 0.94  GLUCOSE 172*  CALCIUM 8.5   No results found for this basename: LABPT:2,INR:2 in the last 72 hours  EXAM General - Patient is Alert, Appropriate and Oriented Extremity - Neurovascular intact Sensation intact distally Dorsiflexion/Plantar flexion intact Dressing - dressing C/D/I Motor Function - intact, moving foot and toes well on exam.  Hemovac pulled without difficulty.  Past Medical History  Diagnosis Date  . Type 2 diabetes mellitus   . Unspecified hypothyroidism   . Other and unspecified hyperlipidemia   . Coronary artery disease   . BPH (benign prostatic hypertrophy)   . Sleep apnea 10 yrs ago    mild sleep apnea no cpap needed  . Arthritis     Assessment/Plan: 1 Day Post-Op Procedure(s) (LRB): TOTAL KNEE ARTHROPLASTY (Right) Active Problems:  Postop Acute blood loss anemia   Advance diet Up with therapy Plan  for discharge tomorrow Discharge home with home health if doing well  DVT Prophylaxis - Xarelto, 81 mg Aspirin on hold Weight-Bearing as tolerated to right leg No vaccines. D/C PCA Morphine, Change to IV push D/C O2 and Pulse OX and try on Room Air  Allure Greaser, Marlowe Sax 09/17/2012, 7:52 AM

## 2012-09-17 NOTE — Progress Notes (Signed)
Physical Therapy Treatment Patient Details Name: Nathan Navarro MRN: [id number] DOB: 02-Jul-1941 Today's Date: 09/17/2012 Time: 4098-1191 PT Time Calculation (min): 21 min  PT Assessment / Plan / Recommendation Comments on Treatment Session       Follow Up Recommendations  Home health PT    Barriers to Discharge        Equipment Recommendations  None recommended by OT    Recommendations for Other Services    Frequency 7X/week   Plan      Precautions / Restrictions Precautions Precautions: Knee Required Braces or Orthoses: Knee Immobilizer - Right Knee Immobilizer - Right: Discontinue once straight leg raise with < 10 degree lag Restrictions Weight Bearing Restrictions: No Other Position/Activity Restrictions: WBAT   Pertinent Vitals/Pain 5/10; premedicated    Mobility  Bed Mobility Bed Mobility: Sit to Supine;Supine to Sit Sit to Supine: 4: Min assist Details for Bed Mobility Assistance: min assist with R LE; cues for sequence Transfers Transfers: Sit to Stand;Stand to Sit Sit to Stand: 4: Min assist Details for Transfer Assistance: cues for use of UEs and to walk legs fwd to sit Ambulation/Gait Ambulation/Gait Assistance: 4: Min assist Ambulation Distance (Feet): 112 Feet Assistive device: Rolling walker Ambulation/Gait Assistance Details: cues for sequence and position from RW Gait Pattern: Step-to pattern    Exercises     PT Diagnosis:    PT Problem List:   PT Treatment Interventions:     PT Goals Acute Rehab PT Goals PT Goal Formulation: With patient Time For Goal Achievement: 09/21/12 Potential to Achieve Goals: Good Pt will go Supine/Side to Sit: with supervision PT Goal: Supine/Side to Sit - Progress: Goal set today Pt will go Sit to Supine/Side: with supervision PT Goal: Sit to Supine/Side - Progress: Goal set today Pt will go Sit to Stand: with supervision PT Goal: Sit to Stand - Progress: Progressing toward goal Pt will go Stand to Sit: with  supervision PT Goal: Stand to Sit - Progress: Progressing toward goal Pt will Ambulate: >150 feet;with supervision;with rolling walker PT Goal: Ambulate - Progress: Progressing toward goal Pt will Go Up / Down Stairs: 1-2 stairs;with min assist;with rolling walker PT Goal: Up/Down Stairs - Progress: Goal set today  Visit Information  Last PT Received On: 09/17/12 Assistance Needed: +1    Subjective Data  Patient Stated Goal: Get this knee bending better than the first one   Cognition  Overall Cognitive Status: Appears within functional limits for tasks assessed/performed Arousal/Alertness: Awake/alert Orientation Level: Appears intact for tasks assessed Behavior During Session: [facility] for tasks performed    Balance     End of Session CPM Right Knee CPM Right Knee: On   GP     Prescious Hurless 09/17/2012, 2:43 PM

## 2012-09-18 LAB — CBC
HCT: 34.9 % — ABNORMAL LOW (ref 39.0–52.0)
MCV: 87 fL (ref 78.0–100.0)
Platelets: 166 10*3/uL (ref 150–400)
RBC: 4.01 MIL/uL — ABNORMAL LOW (ref 4.22–5.81)
RDW: 14.9 % (ref 11.5–15.5)
WBC: 9.5 10*3/uL (ref 4.0–10.5)

## 2012-09-18 LAB — GLUCOSE, CAPILLARY: Glucose-Capillary: 156 mg/dL — ABNORMAL HIGH (ref 70–99)

## 2012-09-18 LAB — BASIC METABOLIC PANEL
BUN: 10 mg/dL (ref 6–23)
CO2: 29 mEq/L (ref 19–32)
Chloride: 98 mEq/L (ref 96–112)
GFR calc Af Amer: >90 mL/min (ref >90)
Potassium: 4.1 mEq/L (ref 3.5–5.1)

## 2012-09-18 MED ORDER — TRAMADOL HCL 50 MG PO TABS: 50.0000 mg | ORAL_TABLET | Freq: Four times a day (QID) | ORAL | Status: DC | PRN

## 2012-09-18 MED ORDER — HYDROCODONE-ACETAMINOPHEN 5-325 MG PO TABS: 1.0000 | ORAL_TABLET | ORAL | Status: DC | PRN

## 2012-09-18 MED ORDER — METHOCARBAMOL 500 MG PO TABS: 500.0000 mg | ORAL_TABLET | Freq: Four times a day (QID) | ORAL | Status: DC | PRN

## 2012-09-18 MED ORDER — RIVAROXABAN 10 MG PO TABS: 10.0000 mg | ORAL_TABLET | Freq: Every day | ORAL | Status: DC

## 2012-09-18 NOTE — Progress Notes (Signed)
   Subjective: 2 Days Post-Op Procedure(s) (LRB): TOTAL KNEE ARTHROPLASTY (Right) Patient reports pain as mild.   Patient seen in rounds for Dr. Lequita Halt. Patient is well, and has had no acute complaints or problems Patient is ready to go home today.  Objective: Vital signs in last 24 hours: Temp:  [98.5 F (36.9 C)-99 F (37.2 C)] 98.5 F (36.9 C) (09/25 0540) Pulse Rate:  [77] 77  (09/25 0540) Resp:  [20] 20  (09/25 0540) BP: (127-150)/(78-79) 127/78 mmHg (09/25 0540) SpO2:  [90 %-91 %] 90 % (09/25 0540)  Intake/Output from previous day:  Intake/Output Summary (Last 24 hours) at 09/18/12 0834 Last data filed at 09/18/12 0600  Gross per 24 hour  Intake    720 ml  Output   2850 ml  Net  -2130 ml    Intake/Output this shift:    Labs:  Basename 09/18/12 0340 09/17/12 0414  HGB 11.8* 12.0*    Basename 09/18/12 0340 09/17/12 0414  WBC 9.5 7.5  RBC 4.01* 4.12*  HCT 34.9* 36.2*  PLT 166 167    Basename 09/18/12 0340 09/17/12 0414  NA 135 138  K 4.1 4.3  CL 98 103  CO2 29 30  BUN 10 12  CREATININE 0.86 0.94  GLUCOSE 168* 172*  CALCIUM 8.9 8.5   No results found for this basename: LABPT:2,INR:2 in the last 72 hours  EXAM: General - Patient is Alert, Appropriate and Oriented Extremity - Neurovascular intact Sensation intact distally Dorsiflexion/Plantar flexion intact No cellulitis present Incision - clean, dry, no drainage, healing Motor Function - intact, moving foot and toes well on exam.   Assessment/Plan: 2 Days Post-Op Procedure(s) (LRB): TOTAL KNEE ARTHROPLASTY (Right) Procedure(s) (LRB): TOTAL KNEE ARTHROPLASTY (Right) Past Medical History  Diagnosis Date  . Type 2 diabetes mellitus   . Unspecified hypothyroidism   . Other and unspecified hyperlipidemia   . Coronary artery disease   . BPH (benign prostatic hypertrophy)   . Sleep apnea 10 yrs ago    mild sleep apnea no cpap needed  . Arthritis    Active Problems:  Postop Acute blood loss  anemia   Up with therapy Discharge home with home health Diet - Cardiac diet and Diabetic diet Follow up - in 2 weeks Activity - WBAT Disposition - Home Condition Upon Discharge - Good D/C Meds - See DC Summary DVT Prophylaxis - Xarelto, 81 mg Aspirin on hold   PERKINS, ALEXZANDREW 09/18/2012, 8:34 AM

## 2012-09-18 NOTE — Progress Notes (Signed)
Physical Therapy Treatment Patient Details Name: Nathan Navarro MRN: [id number] DOB: [birth year] Today's Date: 09/18/2012 Time: 7829-5621 PT Time Calculation (min): 38 min  PT Assessment / Plan / Recommendation Comments on Treatment Session  Pt progressing well with mobility, ambulated 68' with RW. Stair training completed. Reviewed HEP. REady to DC home today from PT standpoint.     Follow Up Recommendations  Home health PT;Supervision - Intermittent    Barriers to Discharge        Equipment Recommendations  None recommended by PT    Recommendations for Other Services    Frequency 7X/week   Plan Discharge plan remains appropriate;Frequency remains appropriate    Precautions / Restrictions Precautions Precautions: Knee Required Braces or Orthoses: Knee Immobilizer - Right Knee Immobilizer - Right: Discontinue once straight leg raise with < 10 degree lag Restrictions Weight Bearing Restrictions: No Other Position/Activity Restrictions: WBAT   Pertinent Vitals/Pain **5/10 R knee with walking Ice applied, LEs elevated*    Mobility  Bed Mobility Bed Mobility: Supine to Sit Supine to Sit: 4: Min assist Details for Bed Mobility Assistance: min assist with R LE; cues for sequence Transfers Transfers: Sit to Stand;Stand to Sit Sit to Stand: 5: Supervision;With upper extremity assist;From bed Stand to Sit: 5: Supervision;With upper extremity assist;To chair/3-in-1 Details for Transfer Assistance: cues for use of UEs and to walk legs fwd to sit Ambulation/Gait Ambulation/Gait Assistance: 5: Supervision Ambulation Distance (Feet): 80 Feet Assistive device: Rolling walker Gait Pattern: Step-to pattern General Gait Details: Distance limited by B triceps pain from using RW, pt steady with RW, no LOB Stairs: Yes Stairs Assistance: 5: Supervision Stairs Assistance Details (indicate cue type and reason): VCs sequencing Stair Management Technique: No rails;Backwards;With  walker Number of Stairs: 1     Exercises Total Joint Exercises Ankle Circles/Pumps: AROM;10 reps;Supine;Both Quad Sets: AROM;Both;10 reps;Supine Towel Squeeze: AROM;Both;10 reps;Supine Short Arc Quad: AAROM;Right;10 reps;Supine Heel Slides: AAROM;10 reps;Supine;Right Hip ABduction/ADduction: AAROM;Right;10 reps;Supine Straight Leg Raises: AAROM;10 reps;Right;Supine   PT Diagnosis:    PT Problem List:   PT Treatment Interventions:     PT Goals Acute Rehab PT Goals PT Goal Formulation: With patient Time For Goal Achievement: 09/21/12 Potential to Achieve Goals: Good Pt will go Supine/Side to Sit: with supervision PT Goal: Supine/Side to Sit - Progress: Progressing toward goal Pt will go Sit to Supine/Side: with supervision Pt will go Sit to Stand: with supervision PT Goal: Sit to Stand - Progress: Met Pt will go Stand to Sit: with supervision PT Goal: Stand to Sit - Progress: Met Pt will Ambulate: >150 feet;with supervision;with rolling walker PT Goal: Ambulate - Progress: Progressing toward goal Pt will Go Up / Down Stairs: 1-2 stairs;with min assist;with rolling walker PT Goal: Up/Down Stairs - Progress: Met  Visit Information  Last PT Received On: 09/18/12 Assistance Needed: +1    Subjective Data  Subjective: Keep moving my knee, it needs it.  Patient Stated Goal: golf, yardwork   Cognition  Overall Cognitive Status: Appears within functional limits for tasks assessed/performed Arousal/Alertness: Awake/alert Orientation Level: Appears intact for tasks assessed Behavior During Session: [facility] for tasks performed    Balance     End of Session PT - End of Session Activity Tolerance: Patient tolerated treatment well Patient left: in chair;with call bell/phone within reach Nurse Communication: Mobility status CPM Right Knee CPM Right Knee: Off   GP     Nathan Navarro 09/18/2012, 8:57 AM [phone]

## 2012-09-18 NOTE — Progress Notes (Signed)
CARE MANAGEMENT NOTE 09/18/2012  Patient:  NEFTALI, HARTLING   Account Number:  [id number]  Date Initiated:  09/18/2012  Documentation initiated by:  Colleen Can  Subjective/Objective Assessment:   DX RT KNEE OSTEOARTHRITIS; TOTAL KNEE REPLACEMNT  rEFERRED TO [facility] PRIOR TO ADMISSION BY DOCTOR'S OFFICE     Action/Plan:   hOME WITH hh pt; SERVICES WILL START TOMORROW   Anticipated DC Date:  09/18/2012   Anticipated DC Plan:  HOME W HOME HEALTH SERVICES  In-house referral  NA      DC Planning Services  CM consult      [facility] Choice  HOME HEALTH   Choice offered to / List presented to:          HH arranged  HH-2 PT      [facility] agency  [facility]   Status of service:  Completed, signed off Medicare Important Message given?  NA - LOS <3 / Initial given by admissions (If response is "NO", the following Medicare IM given date fields will be blank) Date Medicare IM given:   Date Additional Medicare IM given:    Discharge Disposition:  HOME W HOME HEALTH SERVICES

## 2012-09-18 NOTE — Discharge Summary (Signed)
Physician Discharge Summary   Patient ID: Nathan Navarro MRN: [id number] DOB/AGE: January 22, 1941 70 y.o.  Admit date: 09/16/2012 Discharge date: 09/18/2012  Primary Diagnosis: Osteoarthritis Right knee   Admission Diagnoses:  Past Medical History  Diagnosis Date  . Type 2 diabetes mellitus   . Unspecified hypothyroidism   . Other and unspecified hyperlipidemia   . Coronary artery disease   . BPH (benign prostatic hypertrophy)   . Sleep apnea 10 yrs ago    mild sleep apnea no cpap needed  . Arthritis    Discharge Diagnoses:   Active Problems:  Postop Acute blood loss anemia  Procedure:  Procedure(s) (LRB): TOTAL KNEE ARTHROPLASTY (Right)   Consults: None  HPI: Nathan Navarro is a 71 y.o. year old male with end stage OA of his right knee with progressively worsening pain and dysfunction. He has constant pain, with activity and at rest and significant functional deficits with difficulties even with ADLs. He has had extensive non-op management including analgesics, injections of cortisone and viscosupplements, and home exercise program, but remains in significant pain with significant dysfunction. Radiographs show bone on bone arthritis medial and patellofemoral compartments. He presents now for right Total Knee Arthroplasty.   Laboratory Data: Hospital Outpatient Visit on 09/09/2012  Component Date Value Range Status  . MRSA, PCR 09/09/2012 NEGATIVE  NEGATIVE Final  . Staphylococcus aureus 09/09/2012 NEGATIVE  NEGATIVE Final   Comment:                                 The Xpert SA Assay (FDA                          approved for NASAL specimens                          in patients over 19 years of age),                          is one component of                          a comprehensive surveillance                          program.  Test performance has                          been validated by [employer] for patients greater        than or equal to 6 year old.                          It is not intended                          to diagnose infection nor to                          guide or monitor treatment.  Marland Kitchen aPTT 09/09/2012 32  24 - 37 seconds Final  . WBC 09/09/2012  5.7  4.0 - 10.5 K/uL Final  . RBC 09/09/2012 5.09  4.22 - 5.81 MIL/uL Final  . Hemoglobin 09/09/2012 14.9  13.0 - 17.0 g/dL Final  . HCT 16/09/9603 44.9  39.0 - 52.0 % Final  . MCV 09/09/2012 88.2  78.0 - 100.0 fL Final  . MCH 09/09/2012 29.3  26.0 - 34.0 pg Final  . MCHC 09/09/2012 33.2  30.0 - 36.0 g/dL Final  . RDW 54/08/8118 14.9  11.5 - 15.5 % Final  . Platelets 09/09/2012 195  150 - 400 K/uL Final  . Sodium 09/09/2012 140  135 - 145 mEq/L Final  . Potassium 09/09/2012 4.6  3.5 - 5.1 mEq/L Final  . Chloride 09/09/2012 102  96 - 112 mEq/L Final  . CO2 09/09/2012 33* 19 - 32 mEq/L Final  . Glucose, Bld 09/09/2012 126* 70 - 99 mg/dL Final  . BUN 14/78/2956 14  6 - 23 mg/dL Final  . Creatinine, Ser 09/09/2012 0.92  0.50 - 1.35 mg/dL Final  . Calcium 21/30/8657 9.6  8.4 - 10.5 mg/dL Final  . Total Protein 09/09/2012 7.1  6.0 - 8.3 g/dL Final  . Albumin 84/69/6295 3.9  3.5 - 5.2 g/dL Final  . AST 28/41/3244 15  0 - 37 U/L Final  . ALT 09/09/2012 15  0 - 53 U/L Final  . Alkaline Phosphatase 09/09/2012 51  39 - 117 U/L Final  . Total Bilirubin 09/09/2012 0.4  0.3 - 1.2 mg/dL Final  . GFR calc non Af Amer 09/09/2012 83* >90 mL/min Final  . GFR calc Af Amer 09/09/2012 >90  >90 mL/min Final   Comment:                                 The eGFR has been calculated                          using the CKD EPI equation.                          This calculation has not been                          validated in all clinical                          situations.                          eGFR's persistently                          <90 mL/min signify                          possible Chronic Kidney Disease.  Marland Kitchen Prothrombin Time 09/09/2012 13.6  11.6 -  15.2 seconds Final  . INR 09/09/2012 1.02  0.00 - 1.49 Final  . Color, Urine 09/09/2012 YELLOW  YELLOW Final  . APPearance 09/09/2012 CLEAR  CLEAR Final  . Specific Gravity, Urine 09/09/2012 1.023  1.005 - 1.030 Final  . pH 09/09/2012 5.5  5.0 - 8.0 Final  . Glucose, UA 09/09/2012 NEGATIVE  NEGATIVE mg/dL Final  . Hgb urine dipstick 09/09/2012 NEGATIVE  NEGATIVE Final  . Bilirubin  Urine 09/09/2012 NEGATIVE  NEGATIVE Final  . Ketones, ur 09/09/2012 NEGATIVE  NEGATIVE mg/dL Final  . Protein, ur 11/91/4782 NEGATIVE  NEGATIVE mg/dL Final  . Urobilinogen, UA 09/09/2012 0.2  0.0 - 1.0 mg/dL Final  . Nitrite 95/62/1308 NEGATIVE  NEGATIVE Final  . Leukocytes, UA 09/09/2012 NEGATIVE  NEGATIVE Final   MICROSCOPIC NOT DONE ON URINES WITH NEGATIVE PROTEIN, BLOOD, LEUKOCYTES, NITRITE, OR GLUCOSE <1000 mg/dL.    Basename 09/18/12 0340 09/17/12 0414  HGB 11.8* 12.0*    Basename 09/18/12 0340 09/17/12 0414  WBC 9.5 7.5  RBC 4.01* 4.12*  HCT 34.9* 36.2*  PLT 166 167    Basename 09/18/12 0340 09/17/12 0414  NA 135 138  K 4.1 4.3  CL 98 103  CO2 29 30  BUN 10 12  CREATININE 0.86 0.94  GLUCOSE 168* 172*  CALCIUM 8.9 8.5   No results found for this basename: LABPT:2,INR:2 in the last 72 hours  X-Rays:No results found.  EKG: Orders placed during the hospital encounter of 07/01/12  . EKG 12-LEAD  . EKG 12-LEAD  . EKG     Hospital Course:  Nathan Navarro is a 71 y.o. who was admitted to [facility]. They were brought to the operating room on 09/16/2012 and underwent Procedure(s): TOTAL KNEE ARTHROPLASTY.  Patient tolerated the procedure well and was later transferred to the recovery room and then to the orthopaedic floor for postoperative care.  They were given PO and IV analgesics for pain control following their surgery.  They were given 24 hours of postoperative antibiotics of  Anti-infectives     Start     Dose/Rate Route Frequency Ordered Stop   09/16/12 1300   ceFAZolin  (ANCEF) IVPB 2 g/50 mL premix        2 g 100 mL/hr over 30 Minutes Intravenous Every 6 hours 09/16/12 1233 09/16/12 1918   09/16/12 0530   ceFAZolin (ANCEF) 3 g in dextrose 5 % 50 mL IVPB        3 g 160 mL/hr over 30 Minutes Intravenous 60 min pre-op 09/16/12 0530 09/16/12 0714         and started on DVT prophylaxis in the form of Xarelto.   PT and OT were ordered for total joint protocol.  Discharge planning consulted to help with postop disposition and equipment needs.  Patient had a decent night on the evening of surgery and started to get up OOB with therapy on day one.  PCA Morphine was discontinued and they were weaned over to PO meds.  Hemovac drain was pulled without difficulty.  Continued to work with therapy into day two.  Dressing was changed on day two and the incision was healing fine.  Patient was seen in rounds and was ready to go home later that same day in the afternoon.  Discharge Medications: Prior to Admission medications   Medication Sig Start Date End Date Taking? Authorizing Provider  Insulin Glargine (LANTUS SOLOSTAR [place]) Inject 40 Units into the skin every morning.    Yes Historical Provider, MD  insulin lispro (HUMALOG) 100 UNIT/ML injection Inject 10 Units into the skin daily with supper. 10 units prior to your dinner meal 07/22/12  Yes Heather M Marte, PA-C  levothyroxine (SYNTHROID, LEVOTHROID) 200 MCG tablet Take 1 tablet (200 mcg total) by mouth every morning. 09/04/12  Yes Maurice March, MD  metFORMIN (GLUCOPHAGE) 1000 MG tablet Take 1,000 mg by mouth 2 (two) times daily with a meal.   Yes Historical Provider,  MD  nebivolol (BYSTOLIC) 5 MG tablet Take 5 mg by mouth every morning.    Yes Historical Provider, MD  rosuvastatin (CRESTOR) 20 MG tablet Take 40 mg by mouth every morning.    Yes Historical Provider, MD  HYDROcodone-acetaminophen (NORCO/VICODIN) 5-325 MG per tablet Take 1-2 tablets by mouth every 4 (four) hours as needed (Moderate Pain). 09/18/12    Victorino Fatzinger Julien Girt, PA  methocarbamol (ROBAXIN) 500 MG tablet Take 1 tablet (500 mg total) by mouth every 6 (six) hours as needed. 09/18/12   Kaydra Borgen Julien Girt, PA  rivaroxaban (XARELTO) 10 MG TABS tablet Take 1 tablet (10 mg total) by mouth daily with breakfast. Take Xarelto for two and a half more weeks, then discontinue Xarelto. Once the patient has completed the Xarelto, they may resume the 81 mg Aspirin. 09/18/12   Zayana Salvador Julien Girt, PA  traMADol (ULTRAM) 50 MG tablet Take 1-2 tablets (50-100 mg total) by mouth every 6 (six) hours as needed (mild pain). 09/18/12   Ondra Deboard, PA    Diet: Cardiac diet and Diabetic diet Activity:WBAT Follow-up:in 2 weeks Disposition - Home Discharged Condition: good   Discharge Orders    Future Orders Please Complete By Expires   Diet - low sodium heart healthy      Diet Carb Modified      Call MD / Call 911      Comments:   If you experience chest pain or shortness of breath, CALL 911 and be transported to the hospital emergency room.  If you develope a fever above 101 F, pus (white drainage) or increased drainage or redness at the wound, or calf pain, call your surgeon's office.   Discharge instructions      Comments:   Pick up stool softner and laxative for home. Do not submerge incision under water. May shower. Continue to use ice for pain and swelling from surgery.  Take Xarelto for two and a half more weeks, then discontinue Xarelto. Once the patient has completed the Xarelto, they may resume the 81 mg Aspirin.   Constipation Prevention      Comments:   Drink plenty of fluids.  Prune juice may be helpful.  You may use a stool softener, such as Colace (over the counter) 100 mg twice a day.  Use MiraLax (over the counter) for constipation as needed.   Increase activity slowly as tolerated      Patient may shower      Comments:   You may shower without a dressing once there is no drainage.  Do not wash over the wound.  If drainage  remains, do not shower until drainage stops.   Driving restrictions      Comments:   No driving until released by the physician.   Lifting restrictions      Comments:   No lifting until released by the physician.   TED hose      Comments:   Use stockings (TED hose) for 3 weeks on both leg(s).  You may remove them at night for sleeping.   Change dressing      Comments:   Change dressing daily with sterile 4 x 4 inch gauze dressing and apply TED hose. Do not submerge the incision under water.   Do not put a pillow under the knee. Place it under the heel.      Do not sit on low chairs, stoools or toilet seats, as it may be difficult to get up from low surfaces  Medication List     As of 09/18/2012  8:40 AM    STOP taking these medications         aspirin 81 MG tablet      fish oil-omega-3 fatty acids 1000 MG capsule      multivitamin tablet      TAKE these medications         HYDROcodone-acetaminophen 5-325 MG per tablet   Commonly known as: NORCO/VICODIN   Take 1-2 tablets by mouth every 4 (four) hours as needed (Moderate Pain).      insulin lispro 100 UNIT/ML injection   Commonly known as: HUMALOG   Inject 10 Units into the skin daily with supper. 10 units prior to your dinner meal      LANTUS SOLOSTAR [place]   Inject 40 Units into the skin every morning.      levothyroxine 200 MCG tablet   Commonly known as: SYNTHROID, LEVOTHROID   Take 1 tablet (200 mcg total) by mouth every morning.      metFORMIN 1000 MG tablet   Commonly known as: GLUCOPHAGE   Take 1,000 mg by mouth 2 (two) times daily with a meal.      methocarbamol 500 MG tablet   Commonly known as: ROBAXIN   Take 1 tablet (500 mg total) by mouth every 6 (six) hours as needed.      nebivolol 5 MG tablet   Commonly known as: BYSTOLIC   Take 5 mg by mouth every morning.      rivaroxaban 10 MG Tabs tablet   Commonly known as: XARELTO   Take 1 tablet (10 mg total) by mouth daily with breakfast. Take  Xarelto for two and a half more weeks, then discontinue Xarelto.  Once the patient has completed the Xarelto, they may resume the 81 mg Aspirin.      rosuvastatin 20 MG tablet   Commonly known as: CRESTOR   Take 40 mg by mouth every morning.      traMADol 50 MG tablet   Commonly known as: ULTRAM   Take 1-2 tablets (50-100 mg total) by mouth every 6 (six) hours as needed (mild pain).           Follow-up Information    Follow up with Loanne Drilling, MD. Schedule an appointment as soon as possible for a visit in 2 weeks.   Contact information:   [facility] [address], [place] [address] [place] [address] [phone]          Signed: Patrica Duel 09/18/2012, 8:40 AM

## 2012-10-22 ENCOUNTER — Other Ambulatory Visit: Payer: Self-pay | Admitting: Family Medicine

## 2012-10-23 NOTE — Telephone Encounter (Signed)
Please pull chart.

## 2012-10-23 NOTE — Telephone Encounter (Signed)
Chart pulled to PA pool at nurses station [phone]

## 2012-10-27 IMAGING — CR DG CHEST 2V
2 series · 2 of 2 positions shown · non-contrast
Comparison: 03/09/2012.

CLINICAL DATA: Preop.

CHEST - 2 VIEW

[w chest pa]
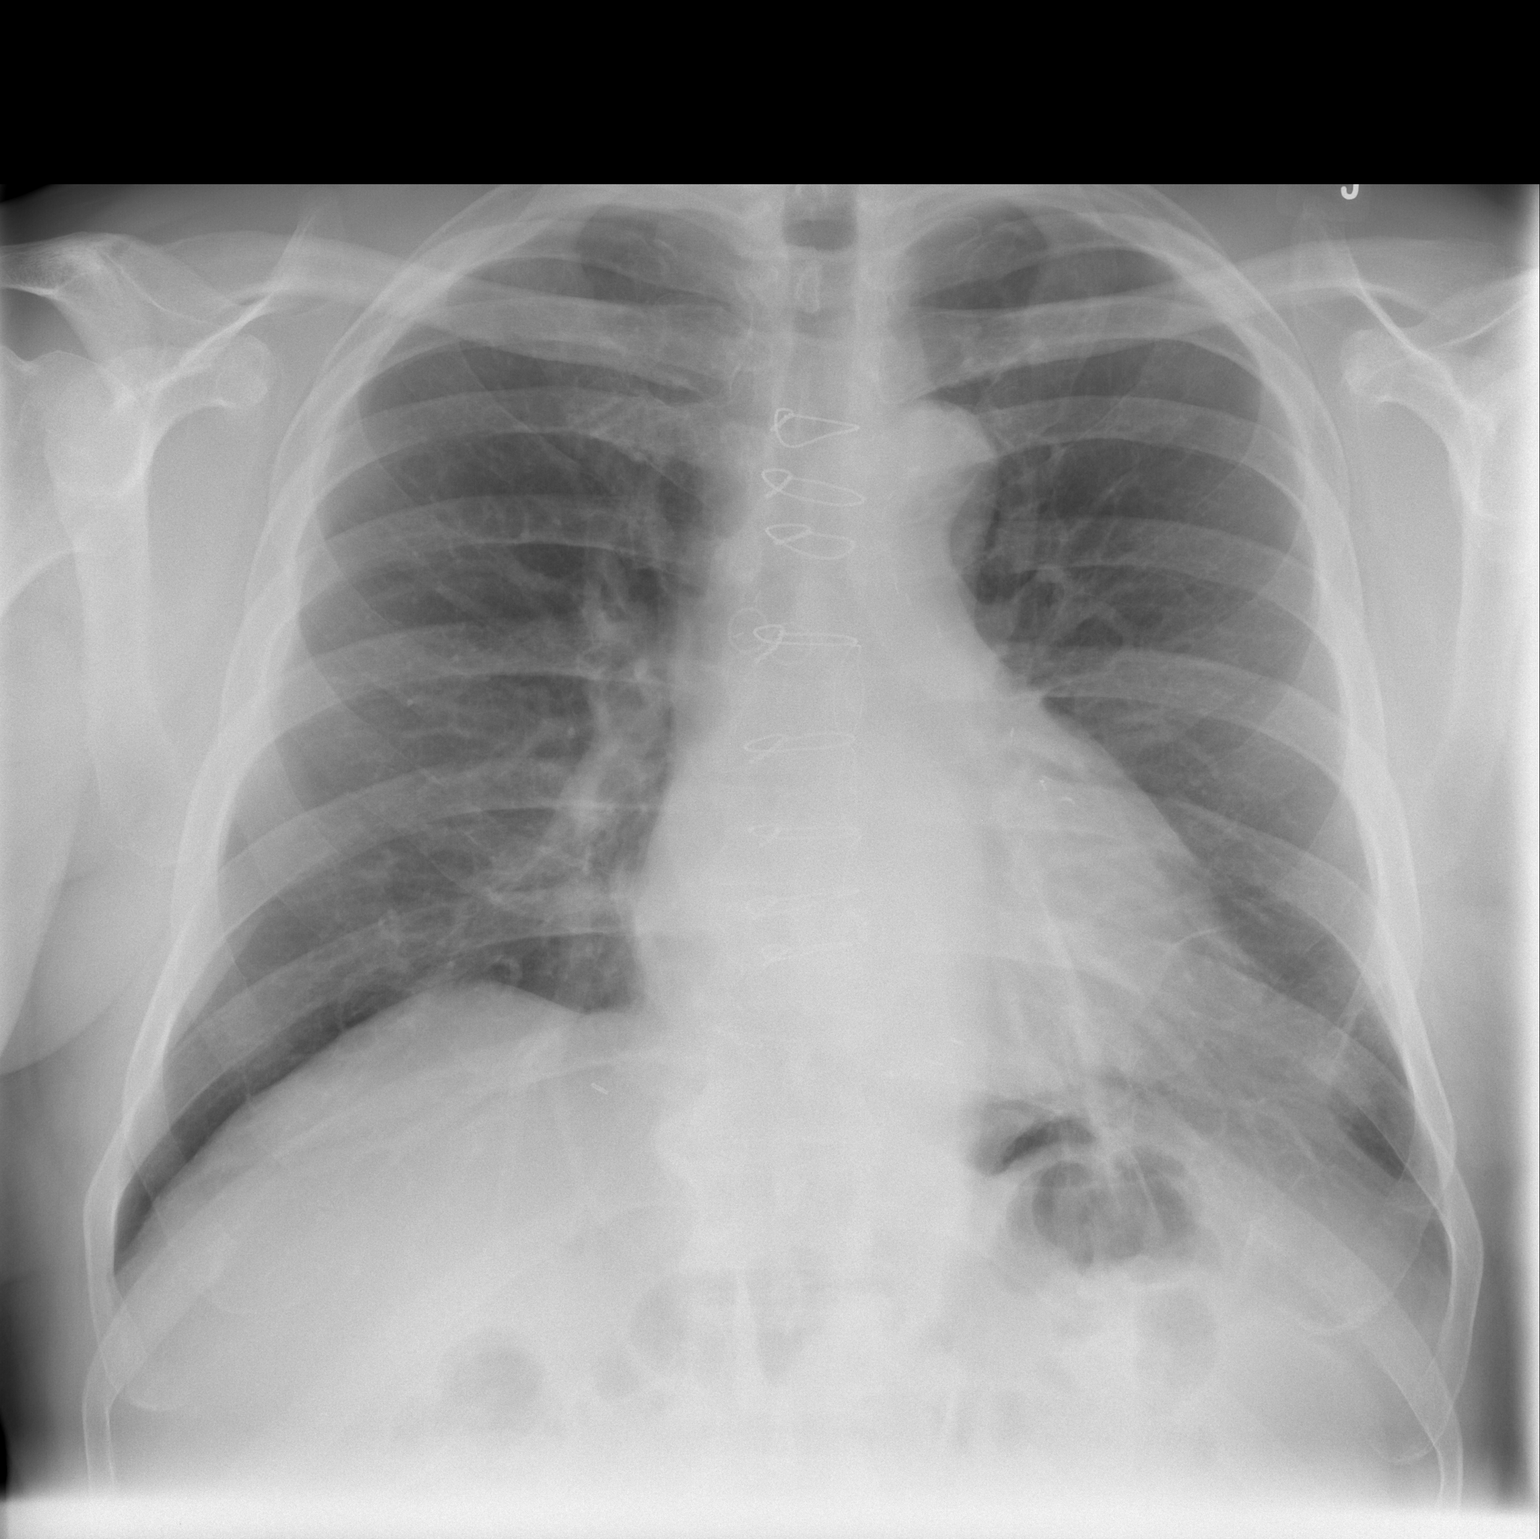

[w chest lat]
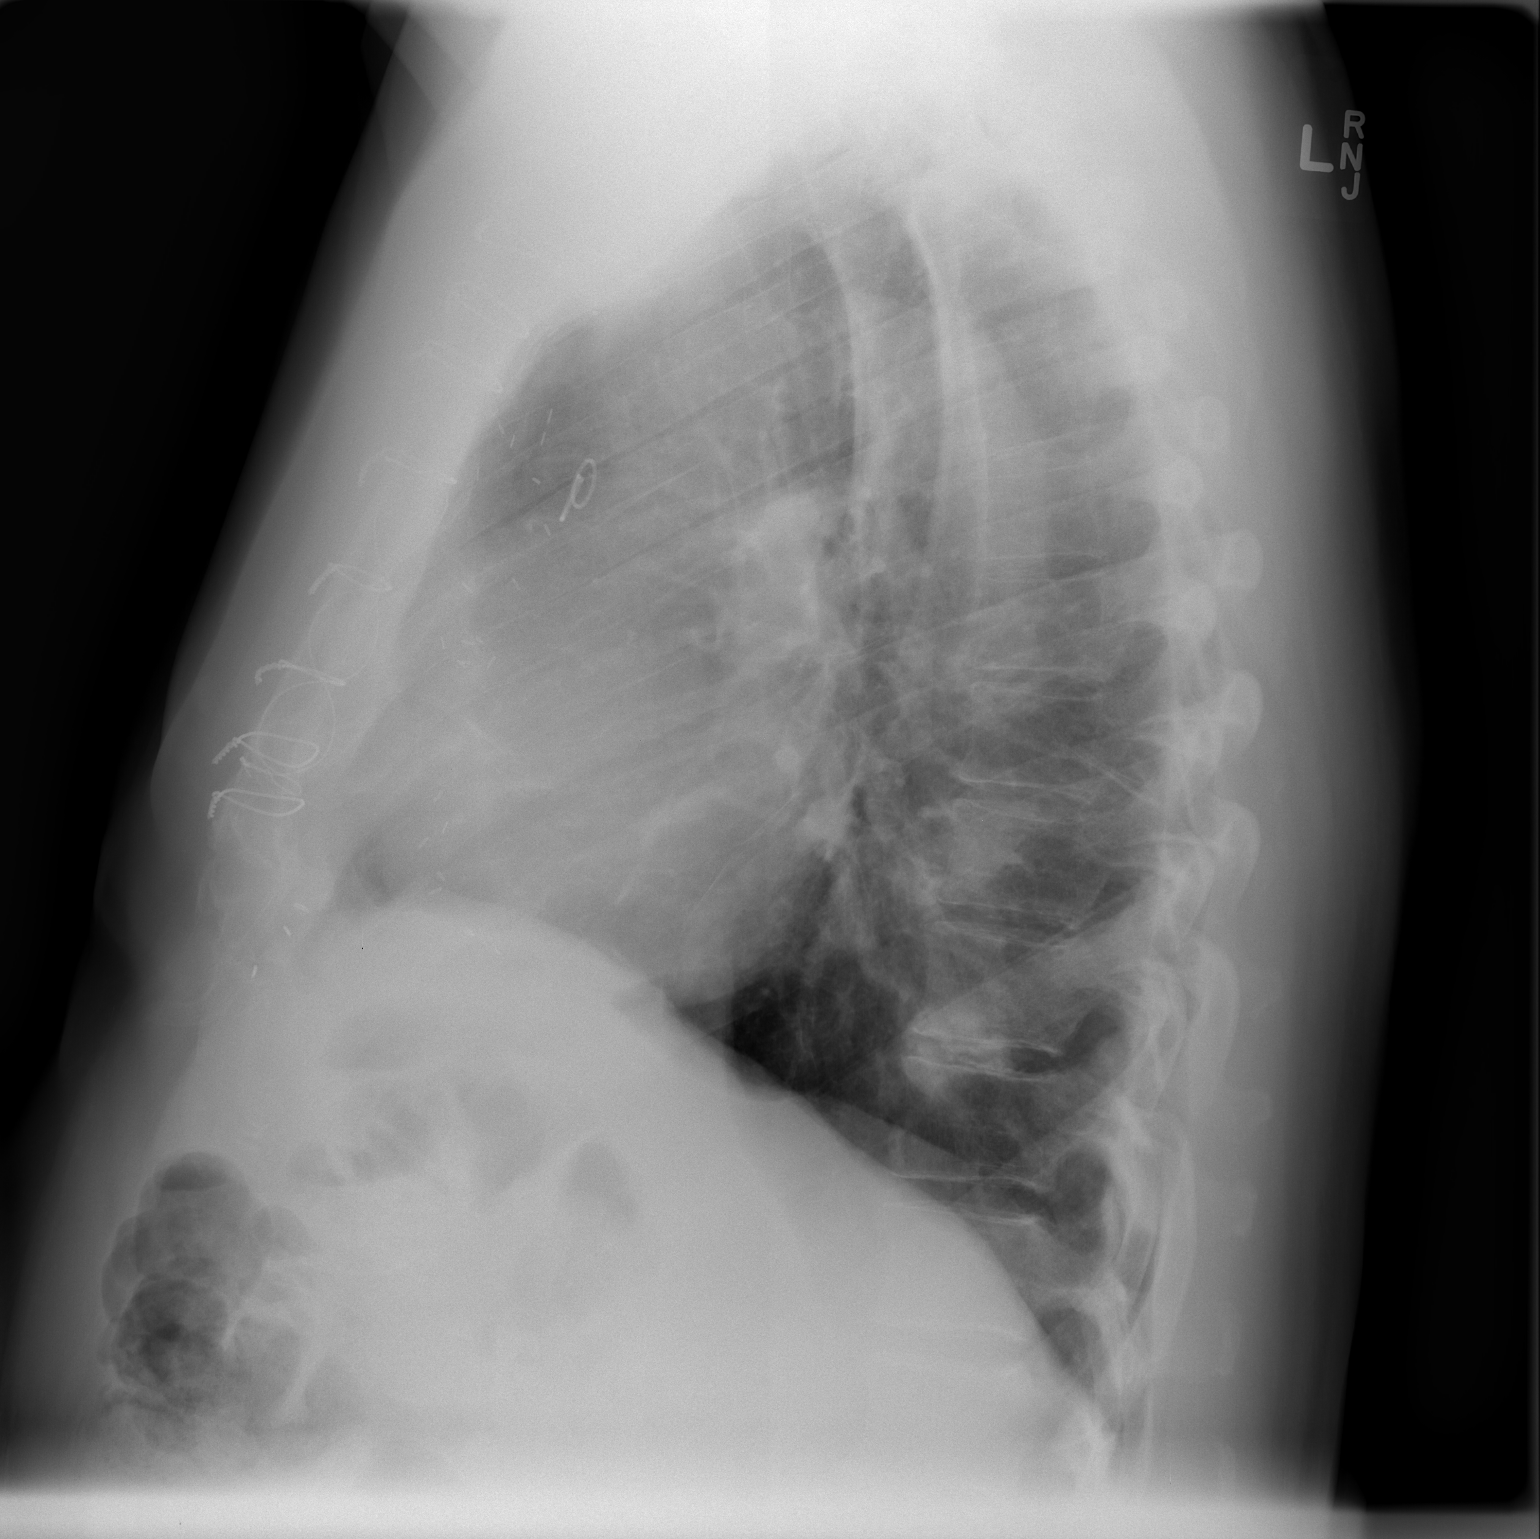

[2 of 2 positions shown; findings below may reference images not displayed]

FINDINGS: Trachea is midline.  Heart size normal.  Pleural scarring
at the base of the left hemithorax.  Lungs are otherwise clear.  No
pleural fluid.
IMPRESSION: No acute findings.

## 2012-11-02 ENCOUNTER — Other Ambulatory Visit: Payer: Self-pay | Admitting: Family Medicine

## 2012-11-13 ENCOUNTER — Telehealth: Payer: Self-pay

## 2012-11-13 MED ORDER — INSULIN PEN NEEDLE 31G X 5 MM MISC: Status: DC

## 2012-11-13 NOTE — Telephone Encounter (Signed)
Pt states that he needs a box of needles for his lantis pen called into [facility] on [address] Best# [phone]

## 2012-11-13 NOTE — Telephone Encounter (Signed)
Sending Rx in for pen needles and notified pt on VM.

## 2012-12-23 ENCOUNTER — Other Ambulatory Visit: Payer: Self-pay | Admitting: Physician Assistant

## 2013-01-03 ENCOUNTER — Ambulatory Visit: Payer: Medicare Other | Admitting: Family Medicine

## 2013-01-13 ENCOUNTER — Telehealth: Payer: Self-pay

## 2013-01-13 MED ORDER — METFORMIN HCL 1000 MG PO TABS: 1000.0000 mg | ORAL_TABLET | Freq: Two times a day (BID) | ORAL | Status: DC

## 2013-01-13 NOTE — Telephone Encounter (Signed)
Sent for him,called him to advise.

## 2013-01-13 NOTE — Telephone Encounter (Signed)
PT NEEDS A REFILL OF METFORMIN SENT TO [facility] ON [address]. HE HAS AN APPT. W/ DR. Audria Nine THIS COMING Friday.  PT'S # [phone]

## 2013-01-14 NOTE — Telephone Encounter (Signed)
Received a notice from [facility] stating that they received a Rx for pt's Metformin and they can not verify eligibility of pt. This Rx was resent to local pharmacy and I have written note to [facility] and sent it back.

## 2013-01-17 ENCOUNTER — Encounter: Payer: Self-pay | Admitting: Family Medicine

## 2013-01-17 ENCOUNTER — Ambulatory Visit (INDEPENDENT_AMBULATORY_CARE_PROVIDER_SITE_OTHER): Payer: Medicare Other | Admitting: Family Medicine

## 2013-01-17 VITALS — BP 136/82 | HR 69 | Temp 98.0°F | Resp 16 | Ht 72.5 in | Wt 249.0 lb

## 2013-01-17 DIAGNOSIS — E786 Lipoprotein deficiency: Secondary | ICD-10-CM

## 2013-01-17 DIAGNOSIS — E039 Hypothyroidism, unspecified: Secondary | ICD-10-CM

## 2013-01-17 DIAGNOSIS — M79672 Pain in left foot: Secondary | ICD-10-CM

## 2013-01-17 DIAGNOSIS — M79609 Pain in unspecified limb: Secondary | ICD-10-CM

## 2013-01-17 DIAGNOSIS — E119 Type 2 diabetes mellitus without complications: Secondary | ICD-10-CM

## 2013-01-17 LAB — COMPREHENSIVE METABOLIC PANEL
AST: 14 U/L (ref 0–37)
Albumin: 4.4 g/dL (ref 3.5–5.2)
BUN: 16 mg/dL (ref 6–23)
CO2: 30 mEq/L (ref 19–32)
Calcium: 9.7 mg/dL (ref 8.4–10.5)
Chloride: 105 mEq/L (ref 96–112)
Creat: 0.91 mg/dL (ref 0.50–1.35)
Glucose, Bld: 147 mg/dL — ABNORMAL HIGH (ref 70–99)
Potassium: 4.8 mEq/L (ref 3.5–5.3)

## 2013-01-17 LAB — T3, FREE: T3, Free: 3.2 pg/mL (ref 2.3–4.2)

## 2013-01-17 LAB — LIPID PANEL
Cholesterol: 135 mg/dL (ref 0–200)
Triglycerides: 121 mg/dL (ref <150)
VLDL: 24 mg/dL (ref 0–40)

## 2013-01-17 LAB — POCT GLYCOSYLATED HEMOGLOBIN (HGB A1C): Hemoglobin A1C: 7.7

## 2013-01-17 LAB — TSH: TSH: 1.077 u[IU]/mL (ref 0.350–4.500)

## 2013-01-17 NOTE — Progress Notes (Addendum)
S:  This 72 y.o. Cauc male has DM and HTN/ CAD (in the care of Dr. Royann Shivers). Pt is s/p bilateral TKR, performed by Dr. Lequita Halt in Sept 2013. He feels well w/ only complaint being related to bilat foot pain (onset after TKR) worse after playing golf or doing yard work. He has hx of lumbar DDD, s/p surgery in remote past; this foot discomfort is not like radicular pain of DDD. He says the sensation is like "very dry skin on bottoms of feet".  Re: DM- pt did not report how often he is testing FSBS but thinks DM is controlled. He checks feet daily and uses a lotion "for Diabetics". He has had no illnesses, fever/chills, fatigue, diaphoresis, vision changes, CP or tightness, SOB, edema, palpitations, GI/GU problems, weakness or syncope.  ROS: As per HPI.  O:  Filed Vitals:   01/17/13 1103  BP: 136/82  Pulse: 69  Temp: 98 F (36.7 C)  Resp: 16   GEN: In NAD; WN,WD. HEENT: [place]/AT. EOMI w/ clear conj/sclerae. EACs normal. Otherwise normal. COR: RRR. No edema. Distal pulses- DP 1-2+/=; TD 1+/=. LUNGS: Normal resp rate and effort. SKIN: W&D; no c/c/e. No rashes or pallor. Feet- scaliness (minimal); normal color, cool to touch but capillary refill is normal; nails discolored and thick. MS: No calf tenderness. Homan's negative. No deformities. NEURO: A&O x 3; CNs intact. Nonfocal.  DTRs undetectable (patella)  Results for orders placed in visit on 01/17/13  POCT GLYCOSYLATED HEMOGLOBIN (HGB A1C)      Component Value Range   Hemoglobin A1C 7.7      A/P: 1. Foot pain, bilateral  Ambulatory referral to Podiatry  2. Type II or unspecified type diabetes mellitus without mention of complication, not stated as uncontrolled  Comprehensive metabolic panel, POCT glycosylated hemoglobin (Hb A1C)  3. Hypothyroidism  TSH, T3, free  4. Low HDL (under 40)  Lipid panel   Pt left before AVS printed.

## 2013-01-18 NOTE — Progress Notes (Signed)
Quick Note:  Please call pt and advise that the following labs are abnormal... Labs look good but lipid panel still shows HDL ("good") cholesterol is below normal. Continue taking your lipid medication and follow-up with your Cardiologist as scheduled. Please let us know if you need refills on this medication and clarify if your are taking the Brand Crestor of the generic.  Blood sugar was above normal and your A1c was 7.7%- the goal is 7.0% (you left the office before getting that result). Try to maintain a healthy diet (reduce your "carbs"/starches and portion sizes) and stay active. Thyroid tests are normal.  Copy to pt. ______

## 2013-01-28 ENCOUNTER — Telehealth: Payer: Self-pay

## 2013-01-28 MED ORDER — NEBIVOLOL HCL 5 MG PO TABS: 5.0000 mg | ORAL_TABLET | Freq: Every day | ORAL | Status: DC

## 2013-01-28 NOTE — Telephone Encounter (Signed)
Patient would like refill of Bystolic sent to [facility]. He says Dr Audria Nine was supposed to send it in after his last visit with her, but the pharmacy doesn't have an rx for that.  Best # for patient [phone]

## 2013-01-28 NOTE — Telephone Encounter (Signed)
Patient advised this is sent in for him

## 2013-02-12 ENCOUNTER — Other Ambulatory Visit: Payer: Self-pay | Admitting: *Deleted

## 2013-02-12 MED ORDER — METFORMIN HCL 1000 MG PO TABS: 1000.0000 mg | ORAL_TABLET | Freq: Two times a day (BID) | ORAL | Status: DC

## 2013-04-03 ENCOUNTER — Telehealth: Payer: Self-pay

## 2013-04-03 MED ORDER — ROSUVASTATIN CALCIUM 40 MG PO TABS: ORAL_TABLET | ORAL | Status: DC

## 2013-04-03 NOTE — Telephone Encounter (Signed)
Notified pt Rx was sent.

## 2013-04-03 NOTE — Telephone Encounter (Signed)
PATIENT IS REQUESTING A REFILL ON CRESTOR. HE USES [facility] [phone].

## 2013-04-14 ENCOUNTER — Other Ambulatory Visit: Payer: Self-pay | Admitting: Family Medicine

## 2013-04-16 ENCOUNTER — Other Ambulatory Visit: Payer: Self-pay | Admitting: Physician Assistant

## 2013-04-30 ENCOUNTER — Other Ambulatory Visit: Payer: Self-pay | Admitting: Family Medicine

## 2013-05-07 ENCOUNTER — Other Ambulatory Visit (HOSPITAL_COMMUNITY): Payer: Self-pay | Admitting: Cardiovascular Disease

## 2013-05-07 DIAGNOSIS — I739 Peripheral vascular disease, unspecified: Secondary | ICD-10-CM

## 2013-05-19 ENCOUNTER — Other Ambulatory Visit: Payer: Self-pay | Admitting: Radiology

## 2013-05-19 MED ORDER — METFORMIN HCL 1000 MG PO TABS: 1000.0000 mg | ORAL_TABLET | Freq: Two times a day (BID) | ORAL | Status: DC

## 2013-05-23 ENCOUNTER — Ambulatory Visit (HOSPITAL_COMMUNITY)
Admission: RE | Admit: 2013-05-23 | Discharge: 2013-05-23 | Disposition: A | Discharge status: Home / Self Care | Payer: Medicare Other | Source: Ambulatory Visit | Attending: Cardiovascular Disease | Admitting: Cardiovascular Disease

## 2013-05-23 DIAGNOSIS — I739 Peripheral vascular disease, unspecified: Secondary | ICD-10-CM

## 2013-05-23 DIAGNOSIS — I70219 Atherosclerosis of native arteries of extremities with intermittent claudication, unspecified extremity: Secondary | ICD-10-CM

## 2013-05-23 NOTE — Progress Notes (Signed)
Lower extremity arterial complete. GMG 

## 2013-07-03 ENCOUNTER — Other Ambulatory Visit: Payer: Self-pay | Admitting: Physician Assistant

## 2013-07-04 ENCOUNTER — Telehealth: Payer: Self-pay

## 2013-07-04 MED ORDER — LEVOTHYROXINE SODIUM 200 MCG PO TABS: 200.0000 ug | ORAL_TABLET | Freq: Every day | ORAL | Status: DC

## 2013-07-04 NOTE — Telephone Encounter (Signed)
Sent in patient needs follow up.

## 2013-07-04 NOTE — Telephone Encounter (Signed)
Pt states pharmacy trying to send [place] refill requests for Synthroid and bystolic.  [facility]  Best: [phone]  bf

## 2013-07-19 ENCOUNTER — Other Ambulatory Visit: Payer: Self-pay | Admitting: Physician Assistant

## 2013-08-05 ENCOUNTER — Other Ambulatory Visit: Payer: Self-pay | Admitting: Family Medicine

## 2013-08-06 ENCOUNTER — Telehealth: Payer: Self-pay | Admitting: Radiology

## 2013-08-06 ENCOUNTER — Other Ambulatory Visit: Payer: Self-pay | Admitting: Family Medicine

## 2013-08-06 NOTE — Telephone Encounter (Signed)
Patient calling about his refills that the pharmacy has sent over.

## 2013-08-06 NOTE — Telephone Encounter (Signed)
Patient needs appt with Dr Audria Nine, please call.

## 2013-08-07 ENCOUNTER — Telehealth: Payer: Self-pay

## 2013-08-07 MED ORDER — LEVOTHYROXINE SODIUM 200 MCG PO TABS: 200.0000 ug | ORAL_TABLET | Freq: Every day | ORAL | Status: DC

## 2013-08-07 NOTE — Telephone Encounter (Signed)
Pt has scheduled an appt with Dr. Audria Nine for 8/29 (she is OFF next week). Needs crestor, synthroid, and bystolic filled til then.  [facility] on [phone]  Pt [phone]

## 2013-08-07 NOTE — Telephone Encounter (Signed)
Appt scheduled for 8/29 , note sent to phone message put in about refilling meds.

## 2013-08-07 NOTE — Telephone Encounter (Signed)
Sent in yesterday crestor bystolic, sent synthroid today.

## 2013-08-18 ENCOUNTER — Telehealth: Payer: Self-pay

## 2013-08-18 MED ORDER — INSULIN GLARGINE 100 UNIT/ML ~~LOC~~ SOLN: 40.0000 [IU] | Freq: Every day | SUBCUTANEOUS | Status: DC

## 2013-08-18 MED ORDER — INSULIN LISPRO 100 UNIT/ML (KWIKPEN): 10.0000 [IU] | PEN_INJECTOR | Freq: Every day | SUBCUTANEOUS | Status: DC

## 2013-08-18 NOTE — Telephone Encounter (Signed)
Spoke with pt and he needs his insulin refilled. He is out. Please advise

## 2013-08-18 NOTE — Telephone Encounter (Signed)
Rx sent to pharmacy   

## 2013-08-22 ENCOUNTER — Encounter: Payer: Self-pay | Admitting: Family Medicine

## 2013-08-22 ENCOUNTER — Ambulatory Visit: Payer: Medicare Other

## 2013-08-22 ENCOUNTER — Ambulatory Visit (INDEPENDENT_AMBULATORY_CARE_PROVIDER_SITE_OTHER): Payer: Medicare Other | Admitting: Family Medicine

## 2013-08-22 VITALS — BP 128/80 | HR 63 | Temp 98.0°F | Resp 16 | Ht 72.5 in | Wt 250.0 lb

## 2013-08-22 DIAGNOSIS — M25571 Pain in right ankle and joints of right foot: Secondary | ICD-10-CM

## 2013-08-22 DIAGNOSIS — M25579 Pain in unspecified ankle and joints of unspecified foot: Secondary | ICD-10-CM

## 2013-08-22 DIAGNOSIS — E785 Hyperlipidemia, unspecified: Secondary | ICD-10-CM

## 2013-08-22 DIAGNOSIS — Z760 Encounter for issue of repeat prescription: Secondary | ICD-10-CM

## 2013-08-22 DIAGNOSIS — E039 Hypothyroidism, unspecified: Secondary | ICD-10-CM

## 2013-08-22 DIAGNOSIS — E119 Type 2 diabetes mellitus without complications: Secondary | ICD-10-CM

## 2013-08-22 LAB — LIPID PANEL: LDL Cholesterol: 57 mg/dL (ref 0–99)

## 2013-08-22 LAB — BASIC METABOLIC PANEL
BUN: 15 mg/dL (ref 6–23)
Creat: 1.01 mg/dL (ref 0.50–1.35)
Potassium: 4.8 mEq/L (ref 3.5–5.3)

## 2013-08-22 LAB — POCT GLYCOSYLATED HEMOGLOBIN (HGB A1C): Hemoglobin A1C: 7.2

## 2013-08-22 LAB — TSH: TSH: 0.087 u[IU]/mL — ABNORMAL LOW (ref 0.350–4.500)

## 2013-08-22 LAB — T4, FREE: Free T4: 1.6 ng/dL (ref 0.80–1.80)

## 2013-08-22 LAB — ALT: ALT: 21 U/L (ref 0–53)

## 2013-08-22 MED ORDER — INSULIN GLARGINE 100 UNIT/ML ~~LOC~~ SOLN: 40.0000 [IU] | Freq: Every day | SUBCUTANEOUS | Status: DC

## 2013-08-22 MED ORDER — METFORMIN HCL 1000 MG PO TABS: 1000.0000 mg | ORAL_TABLET | Freq: Two times a day (BID) | ORAL | Status: DC

## 2013-08-22 MED ORDER — ROSUVASTATIN CALCIUM 20 MG PO TABS: 40.0000 mg | ORAL_TABLET | Freq: Every morning | ORAL | Status: DC

## 2013-08-22 MED ORDER — LEVOTHYROXINE SODIUM 200 MCG PO TABS: 200.0000 ug | ORAL_TABLET | Freq: Every day | ORAL | Status: DC

## 2013-08-22 MED ORDER — NEBIVOLOL HCL 5 MG PO TABS: ORAL_TABLET | ORAL | Status: DC

## 2013-08-22 MED ORDER — INSULIN LISPRO 100 UNIT/ML (KWIKPEN): 10.0000 [IU] | PEN_INJECTOR | Freq: Every day | SUBCUTANEOUS | Status: DC

## 2013-08-22 MED ORDER — INSULIN PEN NEEDLE 31G X 5 MM MISC: Status: DC

## 2013-08-22 MED ORDER — ROSUVASTATIN CALCIUM 40 MG PO TABS: ORAL_TABLET | ORAL | Status: DC

## 2013-08-22 NOTE — Progress Notes (Signed)
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 Pt states FSBS are good but has no documented values; no reports of hypoglycemic symptoms. He is s/p bilateral TKR and is doing well. Pt came in today mainly for medications refills- further refills were denied because he was overdo for office visit. He c/o ongoing foot pain, worse than 8 months ago. Topical analgesic helps but pt has R foot pain that he  attributes to grandchildren stepping on top of foot. He thinks "it may be cracked". Pt plays golf regularly and has increased pain after playing a round of golf; he wears insoles. Pt is in a hurry today as he is scheduled to play golf this morning.  Pt is compliant w/ all medications, reports no adverse effects and requests refills.  Patient Active Problem List   Diagnosis Date Noted  . Postop Acute blood loss anemia 09/17/2012  . OA (osteoarthritis) of knee 04/22/2012  . Cough 03/09/2012  . Type 2 diabetes mellitus   . Unspecified hypothyroidism   . Other and unspecified hyperlipidemia   . Coronary artery disease   . BPH (benign prostatic hypertrophy)    PMHx, Soc Hx and Fam Hx reviewed. Medications reconciled; pt needs refills on all meds.  ROS: As per HPI; negative for fatigue, diaphoresis, vision changes, CP or tightness, palpitations, SOB or DOE, cough, edema, myalgias, GI-GU problems, skin changes, HA, dizziness, unilateral numbness or weakness, slurred speech, tremor or syncope. Sleep hygiene is good and mental status is stable.   O: Filed Vitals:   08/22/13 0842  BP: 128/80  Pulse: 63  Temp: 98 F (36.7 C)  Resp: 16   GEN: In NAD; WN,WD. HENT: [place]/AT; EOMI w/ clear conj/sclerae. Wears corrective lenses. Otherwise unremarkable. COR: RRR. Distal pulses 1+ (DP and TD) and difficult to palpate. LUNGS: Normal resp rate and effort. MS: MAEs; well healed anterior knee scars. R foot- tender at instep; no deformity or discoloration. NEURO:  A&O x 3; CNs intact. Nonfocal.  A1c= 7.2%   [facility] reading (PRIMARY) by  Dr. Audria Nine:  R foot- abnormal lytic lesion of navicular bone; no acute fracture or dislocation.   A/P: Pain in joint, ankle and foot, right - Plan: DG Foot Complete Right  Other and unspecified hyperlipidemia - Plan: Lipid panel, Basic metabolic panel, T4, Free  Type 2 diabetes mellitus - Stable and controlled; pt left before this value was available to discuss w/ him.   Plan: POCT glycosylated hemoglobin (Hb A1C)  Unspecified hypothyroidism - Plan: ALT, TSH, T4, Free  Meds ordered this encounter  Medications  . Multiple Vitamin (MULTIVITAMIN) tablet    Sig: Take 1 tablet by mouth daily.  . nebivolol (BYSTOLIC) 5 MG tablet    Sig: TAKE 1 TABLET (5 MG TOTAL) BY MOUTH DAILY.    Dispense:  30 tablet    Refill:  5  . rosuvastatin (CRESTOR) 40 MG tablet    Sig: TAKE 1 TABLET BY MOUTH DAILY IN THE EVENING    Dispense:  30 tablet    Refill:  5  . insulin glargine (LANTUS) 100 UNIT/ML injection    Sig: Inject 0.4 mLs (40 Units total) into the skin daily.    Dispense:  3 mL    Refill:  5    Pt has had Solostar in the past.    Order Specific Question:  Supervising Provider    Answer:  DOOLITTLE, ROBERT P [[id number]]  . insulin lispro (HUMALOG KWIKPEN) 100  UNIT/ML SOPN    Sig: Inject 10 Units into the skin daily before supper.    Dispense:  15 mL    Refill:  5    Order Specific Question:  Supervising Provider    Answer:  DOOLITTLE, ROBERT P [[id number]]  . levothyroxine (SYNTHROID, LEVOTHROID) 200 MCG tablet    Sig: Take 1 tablet (200 mcg total) by mouth daily before breakfast.    Dispense:  30 tablet    Refill:  5  . metFORMIN (GLUCOPHAGE) 1000 MG tablet    Sig: Take 1 tablet (1,000 mg total) by mouth 2 (two) times daily with a meal.    Dispense:  60 tablet    Refill:  5  . rosuvastatin (CRESTOR) 20 MG tablet    Sig: Take 2 tablets (40 mg total) by mouth every morning.    Dispense:  30 tablet    Refill:  5  .  Insulin Pen Needle 31G X 5 MM MISC    Sig: Use as directed    Dispense:  100 each    Refill:  5    Please fill with pen needle that works with the [employer]

## 2013-08-24 ENCOUNTER — Other Ambulatory Visit: Payer: Self-pay | Admitting: Family Medicine

## 2013-08-24 ENCOUNTER — Telehealth: Payer: Self-pay | Admitting: *Deleted

## 2013-08-24 MED ORDER — LEVOTHYROXINE SODIUM 150 MCG PO TABS: ORAL_TABLET | ORAL | Status: DC

## 2013-08-24 NOTE — Progress Notes (Signed)
Quick Note:  Please contact pt and advise that the following labs are abnormal...  Cholesterol profile looks very great. Metabolic profile is stable. Thyroid result (TSH) suggests that your thyroid dose may be a little to high. I am going to reduce your dose from 200 mcg to 150 mcg. This hahnge has been called to your pharmacy.  Your Diabetes control is good; your A1c = 7.2%.  Copy to pt.  ______

## 2013-08-24 NOTE — Telephone Encounter (Signed)
Error

## 2013-09-23 ENCOUNTER — Other Ambulatory Visit: Payer: Self-pay

## 2013-09-23 MED ORDER — INSULIN GLARGINE 100 UNIT/ML ~~LOC~~ SOLN: 40.0000 [IU] | Freq: Every day | SUBCUTANEOUS | Status: DC

## 2013-09-25 ENCOUNTER — Other Ambulatory Visit (HOSPITAL_COMMUNITY): Payer: Self-pay | Admitting: Cardiovascular Disease

## 2013-09-25 NOTE — Telephone Encounter (Signed)
Please advise 

## 2013-09-25 NOTE — Telephone Encounter (Signed)
Ok to refill 

## 2013-10-27 ENCOUNTER — Ambulatory Visit (INDEPENDENT_AMBULATORY_CARE_PROVIDER_SITE_OTHER): Payer: Medicare Other | Admitting: Family Medicine

## 2013-10-27 VITALS — BP 124/80 | HR 62 | Temp 98.3°F | Resp 18 | Ht 73.0 in | Wt 253.2 lb

## 2013-10-27 DIAGNOSIS — J019 Acute sinusitis, unspecified: Secondary | ICD-10-CM

## 2013-10-27 DIAGNOSIS — E119 Type 2 diabetes mellitus without complications: Secondary | ICD-10-CM

## 2013-10-27 MED ORDER — AMOXICILLIN 500 MG PO CAPS: 1000.0000 mg | ORAL_CAPSULE | Freq: Two times a day (BID) | ORAL | Status: DC

## 2013-10-27 NOTE — Progress Notes (Signed)
[facility] [address] [place] [address] [phone]  Date:  10/27/2013   Name:  Nathan Navarro   DOB:  Jul 22, 1941   MRN:  [id number]  PCP:  Pcp Not In System    Chief Complaint: Cough, Nasal Congestion and Flu Vaccine   History of Present Illness:  Burke Terry is a 72 y.o. very pleasant male patient who presents with the following:  Here today with possible illness.  He played golf a week ago and he noted a runny nose.  This has continued, and then developed into PND.  He does not feel bad and does not have a temp.  He feels congested in his throat.  He does note pain and pressure in his frontal sinuses and thinks he may have a sinus infection.  He is blowing material out of his nose.   He sometimes coughs up some material.    No GI symptoms.   He does feel that his sx may be getting better.  "My wife told me I had to come in."   He has a history of DM and CAD.  Most recent A1c looked good.  He is on insulin  Patient Active Problem List   Diagnosis Date Noted  . OA (osteoarthritis) of knee 04/22/2012  . Cough 03/09/2012  . Type 2 diabetes mellitus   . Unspecified hypothyroidism   . Other and unspecified hyperlipidemia   . Coronary artery disease   . BPH (benign prostatic hypertrophy)     Past Medical History  Diagnosis Date  . Type 2 diabetes mellitus   . Unspecified hypothyroidism   . Other and unspecified hyperlipidemia   . Coronary artery disease   . BPH (benign prostatic hypertrophy)   . Sleep apnea 10 yrs ago    mild sleep apnea no cpap needed  . Arthritis     Past Surgical History  Procedure Laterality Date  . Lumbar spine surgery  1986  . Knee arthroscopy  2009    R knee  . 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 Total knee arthroplasty  04/22/2012    Procedure: TOTAL KNEE ARTHROPLASTY;  Surgeon: Loanne Drilling, MD;  Location: [facility];  Service: Orthopedics;  Laterality: Left;  . Knee  closed reduction  07/01/2012    Procedure: CLOSED MANIPULATION KNEE;  Surgeon: Loanne Drilling, MD;  Location: [facility];  Service: Orthopedics;  Laterality: Left;  . Joint replacement      LEFT TOTAL KNEE 03/2012  . Total knee arthroplasty  09/16/2012    Procedure: TOTAL KNEE ARTHROPLASTY;  Surgeon: Loanne Drilling, MD;  Location: [facility];  Service: Orthopedics;  Laterality: Right;    History  Substance Use Topics  . Smoking status: Never Smoker   . Smokeless tobacco: Current User    Types: Chew  . Alcohol Use: 0.6 oz/week    1 Shots of liquor per week     Comment: occ    Family History  Problem Relation Age of Onset  . Heart disease Father 79    MI  . Heart disease Brother 69    MI  . Heart disease Brother 92    MI    Allergies  Allergen Reactions  . Morphine And Related Nausea Only    Medication list has been reviewed and updated.  Current Outpatient Prescriptions on File Prior to Visit  Medication Sig Dispense Refill  . aspirin 81  MG tablet Take 81 mg by mouth daily.      . insulin glargine (LANTUS) 100 UNIT/ML injection Inject 0.4 mLs (40 Units total) into the skin daily.  15 mL  4  . insulin lispro (HUMALOG KWIKPEN) 100 UNIT/ML SOPN Inject 10 Units into the skin daily before supper.  15 mL  5  . Insulin Pen Needle 31G X 5 MM MISC Use as directed  100 each  5  . levothyroxine (SYNTHROID, LEVOTHROID) 150 MCG tablet Take 1 tablet by mouth every morning.  30 tablet  5  . metFORMIN (GLUCOPHAGE) 1000 MG tablet Take 1 tablet (1,000 mg total) by mouth 2 (two) times daily with a meal.  60 tablet  5  . Multiple Vitamin (MULTIVITAMIN) tablet Take 1 tablet by mouth daily.      . nebivolol (BYSTOLIC) 5 MG tablet TAKE 1 TABLET (5 MG TOTAL) BY MOUTH DAILY.  30 tablet  5  . rosuvastatin (CRESTOR) 40 MG tablet TAKE 1 TABLET BY MOUTH DAILY IN THE EVENING  30 tablet  5  . VIAGRA 100 MG tablet TAKE 1 TABLET BY MOUTH DAILY AS NEEDED.  10 tablet  11  . HYDROcodone-acetaminophen (NORCO/VICODIN)  5-325 MG per tablet Take 1-2 tablets by mouth every 4 (four) hours as needed (Moderate Pain).  80 tablet  0  . methocarbamol (ROBAXIN) 500 MG tablet Take 1 tablet (500 mg total) by mouth every 6 (six) hours as needed.  80 tablet  0  . rosuvastatin (CRESTOR) 20 MG tablet Take 2 tablets (40 mg total) by mouth every morning.  30 tablet  5  . traMADol (ULTRAM) 50 MG tablet Take 1-2 tablets (50-100 mg total) by mouth every 6 (six) hours as needed (mild pain).  80 tablet  0   No current facility-administered medications on file prior to visit.    Review of Systems:  As per HPI- otherwise negative.   Physical Examination: Filed Vitals:   10/27/13 0812  BP: 124/80  Pulse: 62  Temp: 98.3 F (36.8 C)  Resp: 18   Filed Vitals:   10/27/13 0812  Height: 6\' 1"  (1.854 m)  Weight: 253 lb 3.2 oz (114.851 kg)   Body mass index is 33.41 kg/(m^2). Ideal Body Weight: Weight in (lb) to have BMI = 25: 189.1  GEN: WDWN, NAD, Non-toxic, A & O x 3, looks well, overweight HEENT: Atraumatic, Normocephalic. Neck supple. No masses, No LAD.  Nasal cavity is inflamed.    Bilateral TM wnl, oropharynx normal.  PEERL,EOMI.   Ears and Nose: No external deformity. CV: RRR, No M/G/R. No JVD. No thrill. No extra heart sounds. PULM: CTA B, no wheezes, crackles, rhonchi. No retractions. No resp. distress. No accessory muscle use.Marland Kitchen EXTR: No c/c/e NEURO Normal gait.  PSYCH: Normally interactive. Conversant. Not depressed or anxious appearing.  Calm demeanor.    Assessment and Plan: Sinusitis, acute - Plan: amoxicillin (AMOXIL) 500 MG capsule  Type II or unspecified type diabetes mellitus without mention of complication, not stated as uncontrolled  DM has been well controlled Treat for sinusitis with amoxicillin.  If not better in the next few days he will let me know- Sooner if worse.   Encouraged a flu shot as soon as he is well  Signed Abbe Amsterdam, MD

## 2013-10-27 NOTE — Patient Instructions (Signed)
Use the amoxicillin as directed for sinus infection.    Please do be sure to get a flu shot as soon as you are well.

## 2014-02-04 ENCOUNTER — Encounter: Payer: Self-pay | Admitting: Podiatry

## 2014-02-05 NOTE — Progress Notes (Signed)
Refill Foltanx tablet #180 one tablet po bid refill prn per Dr Leeanne Deeduchman.  [facility] [phone].

## 2014-02-19 ENCOUNTER — Other Ambulatory Visit: Payer: Self-pay | Admitting: Family Medicine

## 2014-02-26 ENCOUNTER — Other Ambulatory Visit: Payer: Self-pay | Admitting: Family Medicine

## 2014-03-20 ENCOUNTER — Other Ambulatory Visit: Payer: Self-pay | Admitting: Emergency Medicine

## 2014-03-20 ENCOUNTER — Other Ambulatory Visit: Payer: Self-pay | Admitting: Physician Assistant

## 2014-04-03 ENCOUNTER — Ambulatory Visit (INDEPENDENT_AMBULATORY_CARE_PROVIDER_SITE_OTHER): Payer: Medicare Other | Admitting: Internal Medicine

## 2014-04-03 ENCOUNTER — Ambulatory Visit: Payer: Medicare Other | Admitting: Family Medicine

## 2014-04-03 VITALS — BP 124/72 | HR 59 | Temp 98.7°F | Resp 16 | Ht 73.0 in | Wt 253.0 lb

## 2014-04-03 DIAGNOSIS — E119 Type 2 diabetes mellitus without complications: Secondary | ICD-10-CM

## 2014-04-03 DIAGNOSIS — Z79899 Other long term (current) drug therapy: Secondary | ICD-10-CM

## 2014-04-03 DIAGNOSIS — I25810 Atherosclerosis of coronary artery bypass graft(s) without angina pectoris: Secondary | ICD-10-CM

## 2014-04-03 LAB — COMPREHENSIVE METABOLIC PANEL
ALT: 17 U/L (ref 0–53)
AST: 18 U/L (ref 0–37)
Albumin: 4.2 g/dL (ref 3.5–5.2)
Alkaline Phosphatase: 43 U/L (ref 39–117)
BILIRUBIN TOTAL: 0.7 mg/dL (ref 0.2–1.2)
BUN: 18 mg/dL (ref 6–23)
CO2: 30 meq/L (ref 19–32)
CREATININE: 0.94 mg/dL (ref 0.50–1.35)
Calcium: 9.5 mg/dL (ref 8.4–10.5)
Chloride: 103 mEq/L (ref 96–112)
Glucose, Bld: 79 mg/dL (ref 70–99)
Potassium: 4.7 mEq/L (ref 3.5–5.3)
Sodium: 141 mEq/L (ref 135–145)
Total Protein: 6.8 g/dL (ref 6.0–8.3)

## 2014-04-03 LAB — LIPID PANEL
CHOLESTEROL: 109 mg/dL (ref 0–200)
HDL: 36 mg/dL — ABNORMAL LOW (ref >39)
LDL Cholesterol: 59 mg/dL (ref 0–99)
Total CHOL/HDL Ratio: 3 Ratio
Triglycerides: 72 mg/dL (ref <150)
VLDL: 14 mg/dL (ref 0–40)

## 2014-04-03 LAB — POCT CBC
Granulocyte percent: 66.7 %G (ref 37–80)
HCT, POC: 45.6 % (ref 43.5–53.7)
Hemoglobin: 14.7 g/dL (ref 14.1–18.1)
Lymph, poc: 1.9 (ref 0.6–3.4)
MCH, POC: 31.1 pg (ref 27–31.2)
MCHC: 32.2 g/dL (ref 31.8–35.4)
MCV: 96.5 fL (ref 80–97)
MID (CBC): 0.4 (ref 0–0.9)
MPV: 9.5 fL (ref 0–99.8)
PLATELET COUNT, POC: 202 10*3/uL (ref 142–424)
POC Granulocyte: 4.7 (ref 2–6.9)
POC LYMPH %: 27 % (ref 10–50)
POC MID %: 6.3 % (ref 0–12)
RBC: 4.73 M/uL (ref 4.69–6.13)
RDW, POC: 14.9 %
WBC: 7 10*3/uL (ref 4.6–10.2)

## 2014-04-03 LAB — GLUCOSE, POCT (MANUAL RESULT ENTRY): POC GLUCOSE: 81 mg/dL (ref 70–99)

## 2014-04-03 LAB — POCT GLYCOSYLATED HEMOGLOBIN (HGB A1C): Hemoglobin A1C: 6.6

## 2014-04-03 MED ORDER — INSULIN LISPRO 100 UNIT/ML (KWIKPEN): 10.0000 [IU] | PEN_INJECTOR | Freq: Every day | SUBCUTANEOUS | Status: DC

## 2014-04-03 MED ORDER — INSULIN PEN NEEDLE 31G X 5 MM MISC: Status: DC

## 2014-04-03 MED ORDER — LEVOTHYROXINE SODIUM 150 MCG PO TABS: 150.0000 ug | ORAL_TABLET | Freq: Every day | ORAL | Status: DC

## 2014-04-03 MED ORDER — SILDENAFIL CITRATE 100 MG PO TABS: ORAL_TABLET | ORAL | Status: DC

## 2014-04-03 MED ORDER — INSULIN GLARGINE 100 UNIT/ML ~~LOC~~ SOLN: 40.0000 [IU] | Freq: Every day | SUBCUTANEOUS | Status: DC

## 2014-04-03 MED ORDER — METFORMIN HCL 1000 MG PO TABS: 1000.0000 mg | ORAL_TABLET | Freq: Two times a day (BID) | ORAL | Status: DC

## 2014-04-03 MED ORDER — NEBIVOLOL HCL 5 MG PO TABS: ORAL_TABLET | ORAL | Status: DC

## 2014-04-03 MED ORDER — ROSUVASTATIN CALCIUM 40 MG PO TABS: 40.0000 mg | ORAL_TABLET | Freq: Every day | ORAL | Status: DC

## 2014-04-03 NOTE — Progress Notes (Signed)
   Subjective:    Patient ID: Nathan Navarro, male    DOB: 01/04/1941, 73 y.o.   MRN: [id number]  HPI pt here for medication refill for diabetes, he does not check his sugar at home. He does not have any complaints, he feels great, he works out three days a week and also plays golf. Denies chest pain, headaches, dizziness, or sob. Dr. Audria NineMcpherson is his primary care, he is running out of medicine so he came in here. He is scheduled for complete cardiac exam with cardiology soon. He has had 2 CABGs.  Wants his meds refilled. New patient to me.  Review of Systems     Objective:   Physical Exam  Constitutional: He is oriented to person, place, and time. He appears well-developed and well-nourished. No distress.  HENT:  Head: Normocephalic.  Eyes: EOM are normal.  Cardiovascular: Normal rate, regular rhythm and normal heart sounds.   Pulmonary/Chest: Effort normal and breath sounds normal.  Neurological: He is alert and oriented to person, place, and time. He exhibits normal muscle tone. Coordination normal.  Psychiatric: He has a normal mood and affect. His behavior is normal. Judgment and thought content normal.   Results for orders placed in visit on 04/03/14  POCT CBC      Result Value Ref Range   WBC 7.0  4.6 - 10.2 K/uL   Lymph, poc 1.9  0.6 - 3.4   POC LYMPH PERCENT 27.0  10 - 50 %L   MID (cbc) 0.4  0 - 0.9   POC MID % 6.3  0 - 12 %M   POC Granulocyte 4.7  2 - 6.9   Granulocyte percent 66.7  37 - 80 %G   RBC 4.73  4.69 - 6.13 M/uL   Hemoglobin 14.7  14.1 - 18.1 g/dL   HCT, POC 40.945.6  81.143.5 - 53.7 %   MCV 96.5  80 - 97 fL   MCH, POC 31.1  27 - 31.2 pg   MCHC 32.2  31.8 - 35.4 g/dL   RDW, POC 91.414.9     Platelet Count, POC 202  142 - 424 K/uL   MPV 9.5  0 - 99.8 fL  GLUCOSE, POCT (MANUAL RESULT ENTRY)      Result Value Ref Range   POC Glucose 81  70 - 99 mg/dl  POCT GLYCOSYLATED HEMOGLOBIN (HGB A1C)      Result Value Ref Range   Hemoglobin A1C 6.6     Mr. Patrecia PaceBumgarner left  before I could discuss his medicines with him. He must call me to discuss.  Called him twice now.    Assessment & Plan:  Refuses to see opthalmologist- I encouraged to see See Dr. Audria NineMcpherson for CPE soon See cardiology

## 2014-04-03 NOTE — Patient Instructions (Signed)
Basics of Medication Management  UNDERSTAND YOUR MEDICATIONS   Read all of the labels and inserts that come with your medications. Review the information on this form often.   Know what potential side effects to look for (for each medication).   Know what each of your medications look like (by color, shape, size, stamp). If you are getting confused and having a hard time telling them apart, talk with your caregiver or pharmacist. They may be able to change the medication or help you to identify them more easily.   Check with your pharmacist if you notice a difference in the size or color of your medication.   Get all of your medications at one pharmacy. The pharmacist will have all of your information and understand possible drug interactions.   Ask your caregiver questions about your prescriptions and any over-the-counter medications, vitamins, herbal or dietary supplements that you take.  TAKE YOUR MEDICATION SAFELY   Take medications only as prescribed.   Talk with your caregiver or pharmacist if some of your pills look the same and it is difficult to tell them apart. They can help you to recognize different medications.   Never double up on your medication.   Never take anyone else's medication or share your medications.   Do not stop taking your medication(s) unless you have discussed it with your caregiver.   Do not split, mash, or chew medications unless your caregiver tells you to do so. Tell your caregiver if you have trouble swallowing your medication(s).   For liquid medications, make sure you use the dosing container provided to you.   You may need to avoid alcohol or certain foods or liquids with one or more of your medications. Make sure you remember how to take each medication with some of the tools below.  ORGANIZE YOUR MEDICATIONS   Use a tool, such as a weekly pill box (available at your local pharmacy), written chart from your caregiver, notebook, binder, or your own calendar to  organize your daily medications. Please note: if you are having trouble telling your different medications apart, keep them in the original bottles.   Set cues or reminders for taking your medications. Use watch alarms, mobile device/phone calendar alarms, or sticky notes.   More advanced medication management systems are also available. These offer weekly or monthly options complete with storage, alarms, and visual and audio prompts.   Your system should help you to keep track of the:   Name of medication and dose.   Day.   Time.   Pill to take (by color, shape, size, or name/imprint).   How to take it (with or without certain foods, on an empty stomach, with fluids etc.).   Review your medication schedule with a family member or friend to help you. Other household members should understand your medications.   If you are taking medications on an "as needed" basis such as those for nausea or pain, write down the name, dose, and time you took the medication so that you remember what you have taken.  PLAN AHEAD FOR REFILLS AND TRAVEL   Take your pill box, medications, and calendar system with you when you travel.   Plan ahead for refills as to not run out of your medication(s).   Always carry an updated list of your medications with you. If there is an emergency, a respondent can quickly see what medications you are taking.  STORE AND DISCARD YOUR MEDICATIONS SAFELY   Store medications in   medications.  Keep medications out of children's reach, away from counters and bedside tables. Store them up high in cabinets or shelves.  Check expiration dates regularly.  Learn about the best way to dispose of each medication you take. Find out if your local government recycling program has a Medicine Take Back  program for safe disposal. If not, some medications may be mixed with inedible substances and thrown away in the trash. Certain medications are to be flushed down the toilet. REMEMBER:  Tell your caregiver if you experience side effects, new symptoms, or have other concerns. There may be dosing changes or alternative medications that would be better for you.  Review your medications regularly with your caregiver. Check to see if you need to continue to take each medication, and discuss how well they are working. Medicines, diet, medical conditions, weight changes, and other habits can all affect how medicines work. PEDIATRIC CONSIDERATIONS If you are taking care of an infant or child who needs multiple medications, follow the tips above to organize a medication schedule and safely give and store medications.   Use positive reinforcement (singing, cuddling, reward) for your child to help him or her take necessary medications.  Use only syringes, droppers, dosing spoons, or dosing cups provided by your caregiver or pharmacist.  Always wash your hands before giving medications.  Get to know your child's school medication policies. Meet with the school nurse to review the medication schedule in detail. Never send the medication to school with your child.  Check with your child's caregiver if he or she has trouble taking medication, forgets a dose, or spits it up.  Make sure your child knows how to use an inhaler properly if needed.  Do not give your child over-the-counter cough and cold medicines if they are under 32 years of age.  Avoid giving your child or teenager Aspirin or Aspirin-containing products. Document Released: 03/28/2011 Document Revised: 03/04/2012 Document Reviewed: 03/28/2011 [facility] Patient Information 2014 [place]

## 2014-04-30 ENCOUNTER — Encounter: Payer: Self-pay | Admitting: *Deleted

## 2014-05-01 ENCOUNTER — Encounter: Payer: Self-pay | Admitting: Cardiovascular Disease

## 2014-05-05 ENCOUNTER — Encounter: Payer: Self-pay | Admitting: Cardiovascular Disease

## 2014-05-06 ENCOUNTER — Encounter: Payer: Self-pay | Admitting: Cardiovascular Disease

## 2014-05-06 ENCOUNTER — Ambulatory Visit (INDEPENDENT_AMBULATORY_CARE_PROVIDER_SITE_OTHER): Payer: Medicare Other | Admitting: Cardiovascular Disease

## 2014-05-06 VITALS — BP 140/80 | HR 54 | Resp 16 | Ht 74.0 in | Wt 250.9 lb

## 2014-05-06 DIAGNOSIS — I2510 Atherosclerotic heart disease of native coronary artery without angina pectoris: Secondary | ICD-10-CM

## 2014-05-06 DIAGNOSIS — E785 Hyperlipidemia, unspecified: Secondary | ICD-10-CM

## 2014-05-06 DIAGNOSIS — E119 Type 2 diabetes mellitus without complications: Secondary | ICD-10-CM

## 2014-05-06 DIAGNOSIS — E039 Hypothyroidism, unspecified: Secondary | ICD-10-CM

## 2014-05-06 MED ORDER — GABAPENTIN 100 MG PO CAPS: 100.0000 mg | ORAL_CAPSULE | Freq: Every day | ORAL | Status: DC

## 2014-05-06 NOTE — Patient Instructions (Signed)
Continue current medications.  Dr. Royann Shiversroitoru recommends that you schedule a follow-up appointment in: ONE YEAR.

## 2014-05-06 NOTE — Assessment & Plan Note (Signed)
Clinical I. seem to control. If he continues to lose a significant amount of weight, may consider trying to switch him to oral antidiabetic.

## 2014-05-06 NOTE — Assessment & Plan Note (Signed)
target LDL less than 70 his achieved, focus on raising HDL by weight loss and exercise.

## 2014-05-06 NOTE — Progress Notes (Addendum)
Patient ID: Nathan Navarro, male   DOB: January 03, 1941, 73 y.o.   MRN: [id number]      Reason for office visit CAD s/p CABG  This is a yearly visit, roughly 15 years following redo bypass surgery. Nathan Navarro remains asymptomatic and is physically active. Exercises on a stationary bicycle and elliptical several days a week and plays golf regularly. Has no complaints of exertional chest pain or angina. He denies intermittent claudication. His major limitation is some residual discomfort in his right knee. He had bilateral knee replacement recently. He complains of a burning and stinging discomfort in his feet at night, that resolves when he walks. He recently profile shows all his parameters are within the desirable range with the exception of a stubbornly low HDL cholesterol. He has lost 3 pounds since last year but has lost over 20 pounds over the last couple of years.  His initial bypass surgery was in 1981. His redo procedure was in 2000 (LIMA to LAD, RIMA to RCA, SVG to diagonal, SVG to ramus intermedius artery. He has mild obesity, type 2 diabetes mellitus requiring insulin, hyperlipidemia, hypothyroidism and erectile dysfunction. His most recent nuclear stress test performed in December of 2012 shows normal myocardial perfusion and an ejection fraction of 45%. His previous nuclear study in 2008 showed mild inferolateral ischemia, but this was left for medical therapy since he was asymptomatic. He has not had symptoms of congestive heart failure or known cardiac arrhythmia.   Allergies  Allergen Reactions  . Morphine And Related Nausea Only    Current Outpatient Prescriptions  Medication Sig Dispense Refill  . aspirin 81 MG tablet Take 81 mg by mouth daily.      . fish oil-omega-3 fatty acids 1000 MG capsule Take 2 g by mouth daily.      . insulin glargine (LANTUS) 100 UNIT/ML injection Inject 0.4 mLs (40 Units total) into the skin daily.  15 mL  3  . insulin lispro (HUMALOG KWIKPEN) 100  UNIT/ML KiwkPen Inject 0.1 mLs (10 Units total) into the skin daily before supper.  15 mL  3  . Insulin Pen Needle 31G X 5 MM MISC Use as directed  100 each  3  . L-Methylfolate-B6-B12 (FOLTANX PO) Take by mouth.      . levothyroxine (SYNTHROID, LEVOTHROID) 150 MCG tablet Take 1 tablet (150 mcg total) by mouth daily.  30 tablet  0  . metFORMIN (GLUCOPHAGE) 1000 MG tablet Take 1 tablet (1,000 mg total) by mouth 2 (two) times daily.  60 tablet  3  . Multiple Vitamin (MULTIVITAMIN) tablet Take 1 tablet by mouth daily.      . nebivolol (BYSTOLIC) 5 MG tablet TAKE 1 TABLET (5 MG TOTAL) BY MOUTH DAILY.  30 tablet  3  . rosuvastatin (CRESTOR) 40 MG tablet Take 1 tablet (40 mg total) by mouth daily.  30 tablet  3  . sildenafil (VIAGRA) 100 MG tablet TAKE 1 TABLET BY MOUTH DAILY AS NEEDED.  10 tablet  11   No current facility-administered medications for this visit.    Past Medical History  Diagnosis Date  . Type 2 diabetes mellitus   . Unspecified hypothyroidism   . Other and unspecified hyperlipidemia   . Coronary artery disease   . BPH (benign prostatic hypertrophy)   . Sleep apnea 10 yrs ago    mild sleep apnea no cpap needed  . Arthritis   . Obesity   . Hx of echocardiogram 09/10/2006    EF 50-55%. The left  ventricle is normal in size. Left ventricular systolic function is low normal. Cannot r/o mild hypokinesis of the entire anterior anterolateral and septal wall. Septal motion consistent with post operative state. Poor echo window.  . History of stress test 12/15/2011    The post stress myocardial perfusion images show a normal pattern of perfusion in all regins. The post-stress EF is 45%. Global left ventricular systolic function is mildly reduced. Exercise capacity 9 METS, Exercise capacity is normal. ECG positive for ischemia.. Normal myocardial Perfusion Study.    Past Surgical History  Procedure Laterality Date  . Lumbar spine surgery  1986  . Knee arthroscopy  2009    R knee  .  Coronary artery bypass graft  1981 x 3, 2000 repair 2 and  did 2 more    the second procedurenin 2000(LIMA to LAD, RIMA to right coronary artery, SVG to diagonal, SVG to ramus intermedius)  . Total knee arthroplasty  04/22/2012    Procedure: TOTAL KNEE ARTHROPLASTY;  Surgeon: Loanne DrillingFrank V Aluisio, MD;  Location: [facility];  Service: Orthopedics;  Laterality: Left;  . Knee closed reduction  07/01/2012    Procedure: CLOSED MANIPULATION KNEE;  Surgeon: Loanne DrillingFrank V Aluisio, MD;  Location: [facility];  Service: Orthopedics;  Laterality: Left;  . Joint replacement      LEFT TOTAL KNEE 03/2012  . Total knee arthroplasty  09/16/2012    Procedure: TOTAL KNEE ARTHROPLASTY;  Surgeon: Loanne DrillingFrank V Aluisio, MD;  Location: [facility];  Service: Orthopedics;  Laterality: Right;  . Cardiac catheterization  11/25/1998    Multivessel negative coronary artery disease. 100% occlusion of the proximal left anterior descending. Greater than 75% stenosis of an optional circumflex. 80% stenosis of proximal right coronary artery.Patent sequential saphenous vein graft to diagonal and left anterior descending.   . Cardiac catheterization  07/27/1991    Good ventricular systolic function with an EFof 63% and mild anterolateral wall hypokinesis. Suspected occlusion of the proximal LAD with patent vein graft to the diagonal branch but closure of the vein graft to the LAD. Occlusion of the vein graft to the optional diagonal or intermedius ramus branch    Family History  Problem Relation Age of Onset  . Heart disease Father [id number]    MI  . Heart disease Brother [id number]    MI  . Heart disease Brother [id number]    MI    History   Social History  . Marital Status: Married    Spouse Name: N/A    Number of Children: N/A  . Years of Education: N/A   Occupational History  . Not on file.   Social History Main Topics  . Smoking status: Never Smoker   . Smokeless tobacco: Current User    Types: Chew  . Alcohol Use: 0.6 oz/week    1 Shots of liquor per week      Comment: occ  . Drug Use: No  . Sexual Activity: Yes     Comment: number of sex partners in the last 12 months 1   Other Topics Concern  . Not on file   Social History Narrative   Exercise cardio 3 x a week for one hour    Review of systems: The patient specifically denies any chest pain at rest or with exertion, dyspnea at rest or with exertion, orthopnea, paroxysmal nocturnal dyspnea, syncope, palpitations, focal neurological deficits, intermittent claudication, lower extremity edema, unexplained weight gain, cough, hemoptysis or wheezing.  The patient also denies abdominal pain, nausea, vomiting, dysphagia, diarrhea,  constipation, polyuria, polydipsia, dysuria, hematuria, frequency, urgency, abnormal bleeding or bruising, fever, chills, unexpected weight changes, mood swings, change in skin or hair texture, change in voice quality, auditory or visual problems, allergic reactions or rashes, new musculoskeletal complaints other than usual "aches and pains".   PHYSICAL EXAM BP 140/80  Pulse 54  Resp 16  Ht 6\' 2"  (1.88 m)  Wt 250 lb 14.4 oz (113.807 kg)  BMI 32.20 kg/m2  General: Alert, oriented x3, no distress, mildly obese Head: no evidence of trauma, PERRL, EOMI, no exophtalmos or lid lag, no myxedema, no xanthelasma; normal ears, nose and oropharynx Neck: normal jugular venous pulsations and no hepatojugular reflux; brisk carotid pulses without delay and no carotid bruits Chest: clear to auscultation, no signs of consolidation by percussion or palpation, normal fremitus, symmetrical and full respiratory excursions, sternotomy scar Cardiovascular: normal position and quality of the apical impulse, regular rhythm, normal first and second heart sounds, no murmurs, rubs or gallops Abdomen: no tenderness or distention, no masses by palpation, no abnormal pulsatility or arterial bruits, normal bowel sounds, no hepatosplenomegaly Extremities: Bilateral scars of knee surgery , no clubbing,  cyanosis or edema; 2+ radial, ulnar and brachial pulses bilaterally; 2+ right femoral, posterior tibial and dorsalis pedis pulses; 2+ left femoral, posterior tibial and dorsalis pedis pulses; no subclavian or femoral bruits Neurological: grossly nonfocal   EKG: Sinus bradycardia with a single premature atrial beats, QTC 381 ms  Lipid Panel     Component Value Date/Time   CHOL 109 04/03/2014 0906   TRIG 72 04/03/2014 0906   HDL 36* 04/03/2014 0906   CHOLHDL 3.0 04/03/2014 0906   VLDL 14 04/03/2014 0906   LDLCALC 59 04/03/2014 0906   A1c 6.6% TSH mildly suppressed,  Normal free T4 BMET    Component Value Date/Time   NA 141 04/03/2014 0906   K 4.7 04/03/2014 0906   CL 103 04/03/2014 0906   CO2 30 04/03/2014 0906   GLUCOSE 79 04/03/2014 0906   BUN 18 04/03/2014 0906   CREATININE 0.94 04/03/2014 0906   CREATININE 0.86 09/18/2012 0340   CALCIUM 9.5 04/03/2014 0906   GFRNONAA 86* 09/18/2012 0340   GFRAA >90 09/18/2012 0340     ASSESSMENT AND PLAN Coronary artery disease Asymptomatic despite a very active lifestyle. The focuses on risk factor modification. He does not smoke and has well-controlled diabetes mellitus and hyperlipidemia, with the exception of a slightly low HDL cholesterol. He is encouraged to keep on exercising and keep up his attempts at losing weight.  Unspecified hypothyroidism Recent lab suggests he may be getting a slightly higher than necessary amount of levothyroxine, but he does not have any signs or symptoms of hyperthyroidism.  Type 2 diabetes mellitus Clinical I. seem to control. If he continues to lose a significant amount of weight, may consider trying to switch him to oral antidiabetic.  Other and unspecified hyperlipidemia target LDL less than 70 his achieved, focus on raising HDL by weight loss and exercise.   try gabapentin 100 mg at bedtime daily to see if this helps with his neuropathic pain Orders Placed This Encounter  Procedures  . EKG 12-Lead   Halayna Blane  Thurmon FairMihai Cina Klumpp, MD, [facility] [phone] office [phone] pager

## 2014-05-06 NOTE — Assessment & Plan Note (Signed)
Asymptomatic despite a very active lifestyle. The focuses on risk factor modification. He does not smoke and has well-controlled diabetes mellitus and hyperlipidemia, with the exception of a slightly low HDL cholesterol. He is encouraged to keep on exercising and keep up his attempts at losing weight.

## 2014-05-06 NOTE — Assessment & Plan Note (Signed)
Recent lab suggests he may be getting a slightly higher than necessary amount of levothyroxine, but he does not have any signs or symptoms of hyperthyroidism.

## 2014-05-15 ENCOUNTER — Other Ambulatory Visit: Payer: Self-pay | Admitting: Internal Medicine

## 2014-05-15 NOTE — Telephone Encounter (Signed)
Dr Perrin Maltese, is it OK to change Viagra Rx to the generic 20 mg tablets for cost savings? SEE NOTE FROM PHARMACIST ON REFILL REQUEST.

## 2014-05-19 NOTE — Telephone Encounter (Signed)
Ok to rf with 3 more rfs

## 2014-05-21 ENCOUNTER — Other Ambulatory Visit: Payer: Self-pay | Admitting: Emergency Medicine

## 2014-05-22 NOTE — Telephone Encounter (Signed)
Pharmacy told pt he needs authorization for his levothyroxine (SYNTHROID, LEVOTHROID) 150 MCG tablet, even though he was here recently, and the prescription does not expire until next year please call [phone]

## 2014-08-07 ENCOUNTER — Other Ambulatory Visit: Payer: Self-pay | Admitting: Internal Medicine

## 2014-08-07 ENCOUNTER — Telehealth: Payer: Self-pay

## 2014-08-07 NOTE — Telephone Encounter (Signed)
Pt is needing refills on rosuvastatin (CRESTOR) 40 MG tablet [[id number]][[id number]] and nebivolol (BYSTOLIC) 5 MG tablet [[id number]][[id number]], advised to allow 24-72 hours

## 2014-08-08 NOTE — Telephone Encounter (Signed)
This was sent in yesterday

## 2014-08-19 ENCOUNTER — Other Ambulatory Visit: Payer: Self-pay | Admitting: Internal Medicine

## 2014-09-01 ENCOUNTER — Telehealth: Payer: Self-pay

## 2014-09-01 NOTE — Telephone Encounter (Signed)
Pt's wife called. Pt's father just passed away and pt is very upset. Wanted to know if she could give him one of her Xanax 0.5mg  tomorrow to help him through the service. Spoke with Dr. Perrin Maltese, ok to give him 1/2 tab and if not improving in an hr to go ahead and give him the other 1/2 but no more than that. Pt's wife was very understandable about why she needs to make sure she gives him no more that that.

## 2014-09-03 ENCOUNTER — Other Ambulatory Visit: Payer: Self-pay | Admitting: Internal Medicine

## 2014-09-15 ENCOUNTER — Telehealth: Payer: Self-pay

## 2014-09-15 ENCOUNTER — Other Ambulatory Visit: Payer: Self-pay | Admitting: Internal Medicine

## 2014-09-15 MED ORDER — LEVOTHYROXINE SODIUM 150 MCG PO TABS: 150.0000 ug | ORAL_TABLET | Freq: Every day | ORAL | Status: DC

## 2014-09-15 NOTE — Telephone Encounter (Signed)
Sent in ordered medication. Pt notified.

## 2014-09-15 NOTE — Telephone Encounter (Signed)
Ok to refill thyroid for 6 months

## 2014-09-15 NOTE — Telephone Encounter (Signed)
Pt called saying his pharmacy has requested a refill on his levothyroxine  [phone]

## 2014-09-15 NOTE — Telephone Encounter (Signed)
Pt left prior to Dr. Perrin Maltese being able to discuss plan of care. TSH was drawn last year- not at last OV. Pt advised to schedule an appt- Transferred pt to scheduling. Please advise if we may refill #60 until pt appt. I have pended medication.

## 2014-09-28 ENCOUNTER — Other Ambulatory Visit: Payer: Self-pay | Admitting: Internal Medicine

## 2014-10-05 ENCOUNTER — Other Ambulatory Visit: Payer: Self-pay | Admitting: Internal Medicine

## 2014-11-05 ENCOUNTER — Telehealth: Payer: Self-pay | Admitting: Radiology

## 2014-11-05 ENCOUNTER — Encounter: Payer: Self-pay | Admitting: Family Medicine

## 2014-11-05 ENCOUNTER — Ambulatory Visit (INDEPENDENT_AMBULATORY_CARE_PROVIDER_SITE_OTHER): Payer: Medicare Other | Admitting: Family Medicine

## 2014-11-05 VITALS — BP 140/82 | HR 56 | Temp 98.6°F | Resp 16 | Ht 73.0 in | Wt 257.4 lb

## 2014-11-05 DIAGNOSIS — E1140 Type 2 diabetes mellitus with diabetic neuropathy, unspecified: Secondary | ICD-10-CM

## 2014-11-05 DIAGNOSIS — E039 Hypothyroidism, unspecified: Secondary | ICD-10-CM

## 2014-11-05 DIAGNOSIS — Z79899 Other long term (current) drug therapy: Secondary | ICD-10-CM

## 2014-11-05 DIAGNOSIS — I25810 Atherosclerosis of coronary artery bypass graft(s) without angina pectoris: Secondary | ICD-10-CM

## 2014-11-05 DIAGNOSIS — Z23 Encounter for immunization: Secondary | ICD-10-CM

## 2014-11-05 LAB — BASIC METABOLIC PANEL
BUN: 18 mg/dL (ref 6–23)
CALCIUM: 9.7 mg/dL (ref 8.4–10.5)
CO2: 31 mEq/L (ref 19–32)
Chloride: 104 mEq/L (ref 96–112)
Creat: 1.05 mg/dL (ref 0.50–1.35)
GLUCOSE: 107 mg/dL — AB (ref 70–99)
Potassium: 5.3 mEq/L (ref 3.5–5.3)
Sodium: 142 mEq/L (ref 135–145)

## 2014-11-05 LAB — POCT GLYCOSYLATED HEMOGLOBIN (HGB A1C): Hemoglobin A1C: 7.1

## 2014-11-05 MED ORDER — LEVOTHYROXINE SODIUM 150 MCG PO TABS: 150.0000 ug | ORAL_TABLET | Freq: Every day | ORAL | Status: DC

## 2014-11-05 MED ORDER — NEBIVOLOL HCL 5 MG PO TABS: ORAL_TABLET | ORAL | Status: DC

## 2014-11-05 MED ORDER — METFORMIN HCL 1000 MG PO TABS: ORAL_TABLET | ORAL | Status: DC

## 2014-11-05 MED ORDER — ROSUVASTATIN CALCIUM 40 MG PO TABS: ORAL_TABLET | ORAL | Status: DC

## 2014-11-05 MED ORDER — INSULIN GLARGINE 100 UNIT/ML SOLOSTAR PEN: PEN_INJECTOR | SUBCUTANEOUS | Status: DC

## 2014-11-05 NOTE — Telephone Encounter (Signed)
Error

## 2014-11-05 NOTE — Progress Notes (Signed)
Quick Note:  Please advise pt regarding following labs...  Kidney function is stable and sodium and potassium are normal.  Blood sugar is slightly above normal and A1c= 7.1%. This is an increase from last visit (6.6%). Stay active during the winter months but be mindful about what and how much you are eating (portion sizes matter!)  Continue all current medications and healthy lifestyle! See you next year. ______

## 2014-11-05 NOTE — Progress Notes (Signed)
S:  This healthy 73 y.o. Cauc male has well controlled Type II DM, CAD, hypothyroidism and lipid disorder. He is compliant w/ all medications and healthy lifestyle.   Encounter (documented H&P) today was primarily w/ Lanier ClamNicole Bush, PA-C.    I agree with exam, assessment and plan as outlined by PA-C.  Nothing to add.   B.Vadis Slabach, MD [facility]

## 2014-11-05 NOTE — Progress Notes (Signed)
Subjective:    Patient ID: Nathan Navarro, male    DOB: 07/14/1941, 73 y.o.   MRN: [id number] Patient Active Problem List   Diagnosis Date Noted  . OA (osteoarthritis) of knee 04/22/2012  . Cough 03/09/2012  . Type 2 diabetes mellitus   . Unspecified hypothyroidism   . Other and unspecified hyperlipidemia   . Coronary artery disease   . BPH (benign prostatic hypertrophy)    Prior to Admission medications   Medication Sig Start Date End Date Taking? Authorizing Provider  aspirin 81 MG tablet Take 81 mg by mouth daily.   Yes Historical Provider, MD  fish oil-omega-3 fatty acids 1000 MG capsule Take 2 g by mouth daily.   Yes Historical Provider, MD  gabapentin (NEURONTIN) 100 MG capsule Take 1 capsule (100 mg total) by mouth at bedtime. 05/06/14  Yes Mihai Croitoru, MD  insulin lispro (HUMALOG KWIKPEN) 100 UNIT/ML KiwkPen Inject 0.1 mLs (10 Units total) into the skin daily before supper. 04/03/14  Yes Jonita Albeehris W Guest, MD  Insulin Pen Needle 31G X 5 MM MISC Use as directed 04/03/14  Yes Jonita Albeehris W Guest, MD  LANTUS SOLOSTAR 100 UNIT/ML Solostar Pen INJECT 0.4 MLS (40 UNITS TOTAL) INTO THE SKIN DAILY. 09/28/14  Yes Maurice MarchBarbara B McPherson, MD  levothyroxine (SYNTHROID, LEVOTHROID) 150 MCG tablet Take 1 tablet (150 mcg total) by mouth daily. PATIENT NEEDS OFFICE VISIT/LABS FOR ADDITIONAL REFILLS 09/15/14  Yes Jonita Albeehris W Guest, MD  metFORMIN (GLUCOPHAGE) 1000 MG tablet TAKE 1 TABLET BY MOUTH 2 TIMES DAILY   NEEDS OFFICE VISIT FOR ADDITIONAL REFILLS 10/05/14  Yes Chelle S Jeffery, PA-C  Multiple Vitamin (MULTIVITAMIN) tablet Take 1 tablet by mouth daily.   Yes Historical Provider, MD  nebivolol (BYSTOLIC) 5 MG tablet TAKE 1 TABLET (5MG  TOTAL) BY MOUTH DAILY  NEEDS APPT FOR ADDITIONAL REFILLS 10/05/14  Yes Chelle S Jeffery, PA-C  rosuvastatin (CRESTOR) 40 MG tablet TAKE 1 TABLE BY MOUTH DAILY   NEEDS VISIT FOR ADDITIONAL REFILLS 10/05/14  Yes Chelle S Jeffery, PA-C  sildenafil (VIAGRA) 100 MG tablet TAKE 1 TABLET  BY MOUTH DAILY AS NEEDED. 04/03/14  Yes Jonita Albeehris W Guest, MD  L-Methylfolate-B6-B12 (FOLTANX PO) Take by mouth.    Historical Provider, MD  sildenafil (REVATIO) 20 MG tablet Take 5 tablets by mouth prior to sexual activity.    Jonita Albeehris W Guest, MD   Allergies  Allergen Reactions  . Morphine And Related Nausea Only   HPI  This is a 73 year old male presenting for medication refills. He needs a refill of metformin, lantus, synthroid, bystolic, and crestor.  He reports he does not check his blood sugar at home because he "doesn't like pricking his fingers". He states he feels good. He takes 40 units of lantus in the morning and 10 units of humalog at dinner.   He goes to the gym 3 days a week - he bikes 8 miles bike and does 1 mile on the elliptical. He plays golf 5 days a week.  He sees a cardiologist once a year. Last saw him in May 2015. He started him on gabapentin 100 mg QHS for night time peripheral neuropathy. Patient reports this has helped him.  Review of Systems  Constitutional: Negative.   Respiratory: Negative.   Cardiovascular: Negative.   Genitourinary: Negative.   Musculoskeletal: Negative.   Skin: Negative.   Neurological: Positive for numbness.  Psychiatric/Behavioral: Negative.       Objective:   Physical Exam  Constitutional: He is oriented  to person, place, and time. He appears well-developed and well-nourished. No distress.  HENT:  Head: Normocephalic and atraumatic.  Right Ear: Hearing normal.  Left Ear: Hearing normal.  Nose: Nose normal.  Eyes: Conjunctivae and lids are normal. Right eye exhibits no discharge. Left eye exhibits no discharge. No scleral icterus.  Pulmonary/Chest: Effort normal. No respiratory distress.  Musculoskeletal: Normal range of motion.  Neurological: He is alert and oriented to person, place, and time.  Skin: Skin is warm, dry and intact. No lesion and no rash noted.  Psychiatric: He has a normal mood and affect. His speech is normal and  behavior is normal. Thought content normal.   Results for orders placed or performed in visit on 11/05/14  POCT glycosylated hemoglobin (Hb A1C)  Result Value Ref Range   Hemoglobin A1C 7.1       Assessment & Plan:  1. Type 2 diabetes mellitus with diabetic neuropathy Hgb elevated today to 7.1 from 6.6 in April 2015. He takes his medications as prescribed. He does not check his blood sugar because he "does not like pricking his fingers". Medications were refilled. He will follow up in 6 months. - POCT glycosylated hemoglobin (Hb A1C) - Basic metabolic panel - Insulin Glargine (LANTUS SOLOSTAR) 100 UNIT/ML Solostar Pen; INJECT 0.4 MLS (40 UNITS TOTAL) INTO THE SKIN DAILY.  Dispense: 15 mL; Refill: 3 - metFORMIN (GLUCOPHAGE) 1000 MG tablet; TAKE 1 TABLET BY MOUTH 2 TIMES DAILY   NEEDS OFFICE VISIT FOR ADDITIONAL REFILLS  Dispense: 180 tablet; Refill: 3  2. Coronary artery disease involving coronary bypass graft of native heart without angina pectoris Doing well - no CP, SOB, DOE, palpitations. Last lipid panel April of 2015 and good. Medications refilled. - Basic metabolic panel - nebivolol (BYSTOLIC) 5 MG tablet; TAKE 1 TABLET (5MG  TOTAL) BY MOUTH DAILY  NEEDS APPT FOR ADDITIONAL REFILLS  Dispense: 90 tablet; Refill: 3 - rosuvastatin (CRESTOR) 40 MG tablet; TAKE 1 TABLE BY MOUTH DAILY   NEEDS VISIT FOR ADDITIONAL REFILLS  Dispense: 90 tablet; Refill: 3  3. Hypothyroidism, unspecified hypothyroidism type - levothyroxine (SYNTHROID, LEVOTHROID) 150 MCG tablet; Take 1 tablet (150 mcg total) by mouth daily. PATIENT NEEDS OFFICE VISIT/LABS FOR ADDITIONAL REFILLS  Dispense: 90 tablet; Refill: 3  4. Flu vaccine need - Flu Vaccine QUAD 36+ mos IM   Roswell MinersNicole V. Dyke BrackettBush, PA-C, MHS [facility]  11/05/2014

## 2014-11-06 ENCOUNTER — Ambulatory Visit: Payer: Medicare Other | Admitting: Family Medicine

## 2014-12-15 ENCOUNTER — Telehealth: Payer: Self-pay | Admitting: *Deleted

## 2014-12-15 NOTE — Telephone Encounter (Signed)
Received fax from Dr. Haywood PaoHung's office for colonoscopy completed 03/15/2010.      Diverticula    Medium hemorrhoids   Hemorrhoids   Repeat in 10 years (2021)  Health Maintenance for colonoscopy updated.

## 2014-12-15 NOTE — Telephone Encounter (Signed)
Phoned Dr. Haywood PaoHung's office & spoke with Misty StanleyLisa, who is faxing me his most recent colonoscopy from 2011. Health maintenance and Dr. Haywood PaoHung's info added & updated.

## 2014-12-21 ENCOUNTER — Encounter: Payer: Self-pay | Admitting: *Deleted

## 2014-12-21 LAB — HM COLONOSCOPY

## 2015-04-02 ENCOUNTER — Telehealth: Payer: Self-pay

## 2015-04-02 ENCOUNTER — Other Ambulatory Visit: Payer: Self-pay | Admitting: Physician Assistant

## 2015-04-02 NOTE — Telephone Encounter (Signed)
Pharm sent req to change Rx from Viagra 100 mg to generic sildenafil 20 mg. Pended as pharm sent request.

## 2015-04-05 MED ORDER — SILDENAFIL CITRATE 20 MG PO TABS: ORAL_TABLET | ORAL | Status: DC

## 2015-04-05 NOTE — Telephone Encounter (Signed)
Revatio request completed.

## 2015-04-10 ENCOUNTER — Other Ambulatory Visit: Payer: Self-pay | Admitting: Cardiovascular Disease

## 2015-04-12 NOTE — Telephone Encounter (Signed)
Rx has been sent to the pharmacy electronically. ° °

## 2015-05-06 ENCOUNTER — Telehealth: Payer: Self-pay

## 2015-05-06 ENCOUNTER — Encounter: Payer: Self-pay | Admitting: *Deleted

## 2015-05-06 ENCOUNTER — Ambulatory Visit: Payer: Medicare Other | Admitting: Family Medicine

## 2015-05-06 NOTE — Telephone Encounter (Signed)
Patient stated he is doing fine and he do not need to be seen for his scheduled appointment. Patient stated he has a appointment with his cardiologist next week.

## 2015-05-13 ENCOUNTER — Encounter: Payer: Self-pay | Admitting: Cardiovascular Disease

## 2015-05-13 ENCOUNTER — Ambulatory Visit (INDEPENDENT_AMBULATORY_CARE_PROVIDER_SITE_OTHER): Payer: Medicare Other | Admitting: Cardiovascular Disease

## 2015-05-13 VITALS — BP 126/72 | HR 48 | Resp 16 | Ht 74.0 in | Wt 255.7 lb

## 2015-05-13 DIAGNOSIS — E785 Hyperlipidemia, unspecified: Secondary | ICD-10-CM

## 2015-05-13 DIAGNOSIS — I25810 Atherosclerosis of coronary artery bypass graft(s) without angina pectoris: Secondary | ICD-10-CM

## 2015-05-13 DIAGNOSIS — Z79899 Other long term (current) drug therapy: Secondary | ICD-10-CM | POA: Diagnosis not present

## 2015-05-13 LAB — LIPID PANEL
Cholesterol: 112 mg/dL (ref 0–200)
HDL: 38 mg/dL — ABNORMAL LOW (ref >=40)
LDL CALC: 59 mg/dL (ref 0–99)
Total CHOL/HDL Ratio: 2.9 Ratio
Triglycerides: 73 mg/dL (ref <150)
VLDL: 15 mg/dL (ref 0–40)

## 2015-05-13 LAB — COMPREHENSIVE METABOLIC PANEL
ALT: 18 U/L (ref 0–53)
AST: 18 U/L (ref 0–37)
Albumin: 4.3 g/dL (ref 3.5–5.2)
Alkaline Phosphatase: 46 U/L (ref 39–117)
BILIRUBIN TOTAL: 0.7 mg/dL (ref 0.2–1.2)
BUN: 13 mg/dL (ref 6–23)
CALCIUM: 9.5 mg/dL (ref 8.4–10.5)
CO2: 31 meq/L (ref 19–32)
CREATININE: 1.05 mg/dL (ref 0.50–1.35)
Chloride: 102 mEq/L (ref 96–112)
Glucose, Bld: 96 mg/dL (ref 70–99)
Potassium: 5 mEq/L (ref 3.5–5.3)
Sodium: 142 mEq/L (ref 135–145)
TOTAL PROTEIN: 7.1 g/dL (ref 6.0–8.3)

## 2015-05-13 LAB — HEMOGLOBIN A1C
Hgb A1c MFr Bld: 7.8 % — ABNORMAL HIGH (ref <5.7)
Mean Plasma Glucose: 177 mg/dL — ABNORMAL HIGH (ref <117)

## 2015-05-13 NOTE — Patient Instructions (Signed)
Your physician wants you to follow-up in: 1 Year You will receive a reminder letter in the mail two months in advance. If you don't receive a letter, please call our office to schedule the follow-up appointment.  Your physician recommends that you return for lab work CMP, A1c, Fasting Lipids

## 2015-05-13 NOTE — Progress Notes (Signed)
Patient ID: Nathan Navarro, male   DOB: May 17, 1941, 74 y.o.   MRN: [id number]     Cardiology Office Note   Date:  05/13/2015   ID:  Sherlyn Lick, DOB [birth year], MRN [id number]  PCP:  Ellsworth Lennox, MD  Cardiologist:   Sanda Klein, MD   Chief Complaint  Patient presents with  . Follow-up    No complaints      History of Present Illness: Nathan Navarro is a 74 y.o. male who presents for CAD follow up. He feels great. He plays golf 3-4 days a week, exercises at the gym 3 days a week: 5 miles on elliptical and bike. ECG shows sinus bradycardia and PACs, but he denies fatigue, dizziness, palpitations or syncope. His weight loss has stopped - he actually gained 5 lb. He is muscular, but also obese.  His initial bypass surgery was in 1981. His redo procedure was in 2000 (LIMA to LAD, RIMA to RCA, SVG to diagonal, SVG to ramus intermedius artery. He has mild obesity, type 2 diabetes mellitus requiring insulin, hyperlipidemia, hypothyroidism and erectile dysfunction. His most recent nuclear stress test performed in December of 2012 shows normal myocardial perfusion and an ejection fraction of 45%. His previous nuclear study in 2008 showed mild inferolateral ischemia, but this was left for medical therapy since he was asymptomatic. He has not had symptoms of congestive heart failure or known cardiac arrhythmia.  Past Medical History  Diagnosis Date  . Type 2 diabetes mellitus   . Unspecified hypothyroidism   . Other and unspecified hyperlipidemia   . Coronary artery disease   . BPH (benign prostatic hypertrophy)   . Sleep apnea 10 yrs ago    mild sleep apnea no cpap needed  . Arthritis   . Obesity   . Hx of echocardiogram 09/10/2006    EF 50-55%. The left ventricle is normal in size. Left ventricular systolic function is low normal. Cannot r/o mild hypokinesis of the entire anterior anterolateral and septal wall. Septal motion consistent with post operative state. Poor echo  window.  . History of stress test 12/15/2011    The post stress myocardial perfusion images show a normal pattern of perfusion in all regins. The post-stress EF is 45%. Global left ventricular systolic function is mildly reduced. Exercise capacity 9 METS, Exercise capacity is normal. ECG positive for ischemia.. Normal myocardial Perfusion Study.    Past Surgical History  Procedure Laterality Date  . Lumbar spine surgery  1986  . Knee arthroscopy  2009    R knee  . Coronary artery bypass graft  1981 x 3, 2000 repair 2 and  did 2 more    the second procedurenin 2000(LIMA to LAD, RIMA to right coronary artery, SVG to diagonal, SVG to ramus intermedius)  . Total knee arthroplasty  04/22/2012    Procedure: TOTAL KNEE ARTHROPLASTY;  Surgeon: Gearlean Alf, MD;  Location: [facility];  Service: Orthopedics;  Laterality: Left;  . Knee closed reduction  07/01/2012    Procedure: CLOSED MANIPULATION KNEE;  Surgeon: Gearlean Alf, MD;  Location: [facility];  Service: Orthopedics;  Laterality: Left;  . Joint replacement      LEFT TOTAL KNEE 03/2012  . Total knee arthroplasty  09/16/2012    Procedure: TOTAL KNEE ARTHROPLASTY;  Surgeon: Gearlean Alf, MD;  Location: [facility];  Service: Orthopedics;  Laterality: Right;  . Cardiac catheterization  11/25/1998    Multivessel negative coronary artery disease. 100% occlusion of the proximal left anterior descending. Greater than 75% stenosis  of an optional circumflex. 80% stenosis of proximal right coronary artery.Patent sequential saphenous vein graft to diagonal and left anterior descending.   . Cardiac catheterization  07/27/1991    Good ventricular systolic function with an EFof 63% and mild anterolateral wall hypokinesis. Suspected occlusion of the proximal LAD with patent vein graft to the diagonal branch but closure of the vein graft to the LAD. Occlusion of the vein graft to the optional diagonal or intermedius ramus branch     Current Outpatient Prescriptions    Medication Sig Dispense Refill  . aspirin 81 MG tablet Take 81 mg by mouth daily.    . fish oil-omega-3 fatty acids 1000 MG capsule Take 2 g by mouth daily.    Marland Kitchen gabapentin (NEURONTIN) 100 MG capsule TAKE 1 CAPSULE BY MOUTH AT BEDTIME. 90 capsule 0  . Insulin Glargine (LANTUS SOLOSTAR) 100 UNIT/ML Solostar Pen INJECT 0.4 MLS (40 UNITS TOTAL) INTO THE SKIN DAILY 15 mL 0  . insulin lispro (HUMALOG KWIKPEN) 100 UNIT/ML KiwkPen Inject 0.1 mLs (10 Units total) into the skin daily before supper. 15 mL 3  . Insulin Pen Needle 31G X 5 MM MISC Use as directed 100 each 3  . L-Methylfolate-B6-B12 (FOLTANX PO) Take by mouth.    . levothyroxine (SYNTHROID, LEVOTHROID) 150 MCG tablet Take 1 tablet (150 mcg total) by mouth daily. 90 tablet 3  . metFORMIN (GLUCOPHAGE) 1000 MG tablet TAKE 1 TABLET BY MOUTH 2 TIMES DAILY 180 tablet 3  . Multiple Vitamin (MULTIVITAMIN) tablet Take 1 tablet by mouth daily.    . nebivolol (BYSTOLIC) 5 MG tablet TAKE 1 TABLET (5MG TOTAL) BY MOUTH DAILY 90 tablet 3  . rosuvastatin (CRESTOR) 40 MG tablet TAKE 1 TABLE BY MOUTH DAILY 90 tablet 3  . sildenafil (REVATIO) 20 MG tablet Take 2- 3 tablets by mouth prior to sexual activity. Limit use to once a day. 50 tablet 3   No current facility-administered medications for this visit.    Allergies:   Morphine and related    Social History:  The patient  reports that he has never smoked. His smokeless tobacco use includes Chew. He reports that he drinks about 0.6 oz of alcohol per week. He reports that he does not use illicit drugs.   Family History:  The patient's family history includes Heart disease (age of onset: 24) in his brother; Heart disease (age of onset: 60) in his brother and father.    ROS:  Please see the history of present illness.    Otherwise, review of systems positive for none.   All other systems are reviewed and negative.    PHYSICAL EXAM: VS:  BP 126/72 mmHg  Pulse 48  Resp 16  Ht _0  (1.88 m)  Wt 255  lb 11.2 oz (115.985 kg)  BMI 32.82 kg/m2 , BMI Body mass index is 32.82 kg/(m^2).  General: Alert, oriented x3, no distress Head: no evidence of trauma, PERRL, EOMI, no exophtalmos or lid lag, no myxedema, no xanthelasma; normal ears, nose and oropharynx Neck: normal jugular venous pulsations and no hepatojugular reflux; brisk carotid pulses without delay and no carotid bruits Chest: clear to auscultation, no signs of consolidation by percussion or palpation, normal fremitus, symmetrical and full respiratory excursions, sternotomy scar Cardiovascular: normal position and quality of the apical impulse, regular rhythm with frequent superimposed ectopy, normal first and second heart sounds, no murmurs, rubs or gallops Abdomen: no tenderness or distention, no masses by palpation, no abnormal pulsatility or arterial bruits, normal  bowel sounds, no hepatosplenomegaly Extremities: no clubbing, cyanosis or edema; 2+ radial, ulnar and brachial pulses bilaterally; 2+ right femoral, posterior tibial and dorsalis pedis pulses; 2+ left femoral, posterior tibial and dorsalis pedis pulses; no subclavian or femoral bruits Neurological: grossly nonfocal Psych: euthymic mood, full affect   EKG:  EKG is ordered today. The ekg ordered today demonstrates sinus bradycardia with PACs and blocked PACs   Recent Labs: 11/05/2014: BUN 18; Creatinine 1.05; Potassium 5.3; Sodium 142    Lipid Panel    Component Value Date/Time   CHOL 109 04/03/2014 0906   TRIG 72 04/03/2014 0906   HDL 36* 04/03/2014 0906   CHOLHDL 3.0 04/03/2014 0906   VLDL 14 04/03/2014 0906   LDLCALC 59 04/03/2014 0906      Wt Readings from Last 3 Encounters:  05/13/15 255 lb 11.2 oz (115.985 kg)  11/05/14 257 lb 6.4 oz (116.756 kg)  05/06/14 250 lb 14.4 oz (113.807 kg)      ASSESSMENT AND PLAN:  Coronary artery disease Asymptomatic despite a very active lifestyle. The focuses on risk factor modification. He does not smoke and has  well-controlled diabetes mellitus and hyperlipidemia, with the exception of a slightly low HDL cholesterol. He is encouraged to keep on exercising and resume his attempts at losing weight.  Unspecified hypothyroidism Recent lab suggests he may be getting a slightly higher than necessary amount of levothyroxine, but he does not have any signs or symptoms of hyperthyroidism.  Type 2 diabetes mellitus Borderline control, A1c was 7.1% in November. If he manages to lose a significant amount of weight, may be able to switch to oral antidiabetic.  Other and unspecified hyperlipidemia target LDL less than 70 his achieved, focus on raising HDL by weight loss and exercise.   Current medicines are reviewed at length with the patient today.  The patient does not have concerns regarding medicines.  The following changes have been made:  no change  Labs/ tests ordered today include:  Orders Placed This Encounter  Procedures  . Comp Met (CMET)  . HgB A1c  . Lipid Profile  . EKG 12-Lead     Patient Instructions  Your physician wants you to follow-up in: 1 Year You will receive a reminder letter in the mail two months in advance. If you don't receive a letter, please call our office to schedule the follow-up appointment.  Your physician recommends that you return for lab work CMP, A1c, Fasting Lipids      Signed, Cele Mote, MD  05/13/2015 8:36 PM    Sanda Klein, MD, [facility] [phone] office [phone] pager

## 2015-07-05 ENCOUNTER — Other Ambulatory Visit: Payer: Self-pay | Admitting: Family Medicine

## 2015-07-05 ENCOUNTER — Other Ambulatory Visit: Payer: Self-pay | Admitting: Internal Medicine

## 2015-08-11 ENCOUNTER — Other Ambulatory Visit: Payer: Self-pay | Admitting: Urgent Care

## 2015-08-11 ENCOUNTER — Other Ambulatory Visit: Payer: Self-pay | Admitting: Cardiovascular Disease

## 2015-08-11 NOTE — Telephone Encounter (Signed)
Rx request sent to pharmacy.  

## 2015-08-11 NOTE — Telephone Encounter (Signed)
Spoke with pt, he will come in the next month to be seen and establish care with another provider. Gave one refill.

## 2015-09-22 ENCOUNTER — Other Ambulatory Visit: Payer: Self-pay | Admitting: Urgent Care

## 2015-10-29 ENCOUNTER — Other Ambulatory Visit: Payer: Self-pay | Admitting: Physician Assistant

## 2015-11-02 NOTE — Telephone Encounter (Signed)
Spoke with patient going to schedule diabetes check.

## 2015-11-02 NOTE — Telephone Encounter (Signed)
Noted  

## 2015-11-17 ENCOUNTER — Other Ambulatory Visit: Payer: Self-pay | Admitting: Physician Assistant

## 2015-11-23 ENCOUNTER — Encounter: Payer: Self-pay | Admitting: Physician Assistant

## 2015-11-23 ENCOUNTER — Ambulatory Visit (INDEPENDENT_AMBULATORY_CARE_PROVIDER_SITE_OTHER): Payer: Medicare Other | Admitting: Physician Assistant

## 2015-11-23 ENCOUNTER — Telehealth: Payer: Self-pay

## 2015-11-23 ENCOUNTER — Telehealth: Payer: Self-pay | Admitting: *Deleted

## 2015-11-23 VITALS — BP 123/81 | HR 58 | Temp 97.1°F | Resp 16 | Ht 74.0 in | Wt 257.0 lb

## 2015-11-23 DIAGNOSIS — I25810 Atherosclerosis of coronary artery bypass graft(s) without angina pectoris: Secondary | ICD-10-CM | POA: Diagnosis not present

## 2015-11-23 DIAGNOSIS — Z23 Encounter for immunization: Secondary | ICD-10-CM

## 2015-11-23 DIAGNOSIS — E119 Type 2 diabetes mellitus without complications: Secondary | ICD-10-CM | POA: Diagnosis not present

## 2015-11-23 DIAGNOSIS — E1142 Type 2 diabetes mellitus with diabetic polyneuropathy: Secondary | ICD-10-CM | POA: Insufficient documentation

## 2015-11-23 DIAGNOSIS — Z794 Long term (current) use of insulin: Secondary | ICD-10-CM

## 2015-11-23 DIAGNOSIS — E785 Hyperlipidemia, unspecified: Secondary | ICD-10-CM

## 2015-11-23 DIAGNOSIS — E039 Hypothyroidism, unspecified: Secondary | ICD-10-CM | POA: Diagnosis not present

## 2015-11-23 DIAGNOSIS — N529 Male erectile dysfunction, unspecified: Secondary | ICD-10-CM | POA: Diagnosis not present

## 2015-11-23 LAB — CBC WITH DIFFERENTIAL/PLATELET
BASOS ABS: 0.1 10*3/uL (ref 0.0–0.1)
BASOS PCT: 1 % (ref 0–1)
EOS ABS: 0.3 10*3/uL (ref 0.0–0.7)
Eosinophils Relative: 5 % (ref 0–5)
HCT: 48.4 % (ref 39.0–52.0)
HEMOGLOBIN: 16.3 g/dL (ref 13.0–17.0)
Lymphocytes Relative: 31 % (ref 12–46)
Lymphs Abs: 1.8 10*3/uL (ref 0.7–4.0)
MCH: 31.1 pg (ref 26.0–34.0)
MCHC: 33.7 g/dL (ref 30.0–36.0)
MCV: 92.4 fL (ref 78.0–100.0)
MPV: 10.3 fL (ref 8.6–12.4)
Monocytes Absolute: 0.6 10*3/uL (ref 0.1–1.0)
Monocytes Relative: 11 % (ref 3–12)
NEUTROS ABS: 3 10*3/uL (ref 1.7–7.7)
NEUTROS PCT: 52 % (ref 43–77)
PLATELETS: 197 10*3/uL (ref 150–400)
RBC: 5.24 MIL/uL (ref 4.22–5.81)
RDW: 13.7 % (ref 11.5–15.5)
WBC: 5.8 10*3/uL (ref 4.0–10.5)

## 2015-11-23 LAB — COMPREHENSIVE METABOLIC PANEL
ALK PHOS: 53 U/L (ref 40–115)
ALT: 26 U/L (ref 9–46)
AST: 25 U/L (ref 10–35)
Albumin: 4.4 g/dL (ref 3.6–5.1)
BILIRUBIN TOTAL: 0.9 mg/dL (ref 0.2–1.2)
BUN: 15 mg/dL (ref 7–25)
CALCIUM: 9.4 mg/dL (ref 8.6–10.3)
CO2: 31 mmol/L (ref 20–31)
Chloride: 101 mmol/L (ref 98–110)
Creat: 1.1 mg/dL (ref 0.70–1.18)
GLUCOSE: 107 mg/dL — AB (ref 65–99)
POTASSIUM: 4.7 mmol/L (ref 3.5–5.3)
SODIUM: 139 mmol/L (ref 135–146)
TOTAL PROTEIN: 7.1 g/dL (ref 6.1–8.1)

## 2015-11-23 LAB — GLUCOSE, POCT (MANUAL RESULT ENTRY): POC GLUCOSE: 126 mg/dL — AB (ref 70–99)

## 2015-11-23 LAB — THYROID PANEL WITH TSH
FREE THYROXINE INDEX: 2.3 (ref 1.4–3.8)
T3 Uptake: 32 % (ref 22–35)
T4, Total: 7.1 ug/dL (ref 4.5–12.0)
TSH: 1.894 u[IU]/mL (ref 0.350–4.500)

## 2015-11-23 LAB — LIPID PANEL
CHOL/HDL RATIO: 3.3 ratio (ref <=5.0)
CHOLESTEROL: 121 mg/dL — AB (ref 125–200)
HDL: 37 mg/dL — AB (ref >=40)
LDL CALC: 68 mg/dL (ref <130)
TRIGLYCERIDES: 79 mg/dL (ref <150)
VLDL: 16 mg/dL (ref <30)

## 2015-11-23 LAB — POCT GLYCOSYLATED HEMOGLOBIN (HGB A1C): HEMOGLOBIN A1C: 7.8

## 2015-11-23 LAB — HEMOGLOBIN A1C: Hgb A1c MFr Bld: 7.8 % — AB (ref 4.0–6.0)

## 2015-11-23 MED ORDER — ZOSTER VACCINE LIVE 19400 UNT/0.65ML ~~LOC~~ SOLR: 0.6500 mL | Freq: Once | SUBCUTANEOUS | Status: DC

## 2015-11-23 NOTE — Telephone Encounter (Signed)
Patient is calling to speak to Nathan Navarro. Patient did not specify why when I asked. He states to just please have her call.

## 2015-11-23 NOTE — Telephone Encounter (Signed)
Called patient to advise him the Zostavax vaccine was administered. Patient's spouse answered and I left message to call back.

## 2015-11-23 NOTE — Progress Notes (Signed)
Patient ID: Nathan Navarro, male    DOB: 05/03/1941, 74 y.o.   MRN: [id number]  PCP: Dow AdolphMCPHERSON,BARBARA, MD As she has retired, patient desires to establish care with me.   Subjective:   Chief Complaint  Patient presents with  . Diabetes    check   . Flu Vaccine  . Medication Refill    all meds     HPI Patient presents today for follow-up of T2DM requiring insulin and medication refills.   Currently on Metformin 1000 mg BID, 40 units of Lantus in the morning, and 10 units of Humalog at dinner. Does not check his sugars at home because he doesn't like pricking his finger everyday. Not on any special diet, states "food is my nemesis!". Weight has been stable over the last year, but would like to lose 50 pounds. Spends an hour in the gym 4 days a week, where he bikes 5 miles, does the elliptical for 1 mile, and lifts weights. Also plays golf 4 days a week and enjoys chasing his 2 grandchildren around, ages 653 and 764.   Sees Cardiology annually for monitoring of CAD. Last seen by Dr. Royann Shiversroitoru in May.   Currently on gabapentin 100 mg QHS for nighttime peripheral neuropathy related to his T2DM, nebivolol 5 mg daily for HTN, and rosuvastatin 40 mg daily for HLD.   Patient also due for annual flu vaccination, would like to get this today.      Review of Systems Constitutional: Negative for fever, chills, activity change, appetite change, fatigue and unexpected weight change.  Respiratory: Negative for shortness of breath.  Cardiovascular: Negative for chest pain and palpitations.  Gastrointestinal: Negative for nausea, vomiting, abdominal pain and diarrhea.  Musculoskeletal: Negative for myalgias and back pain.  Neurological: Positive for numbness (diabetic peripheral neuropathy, well-controlled on gabapentin). Negative for dizziness, weakness, light-headedness and headaches.      Patient Active Problem List   Diagnosis Date Noted  . OA (osteoarthritis) of knee 04/22/2012  . Cough  03/09/2012  . Type 2 diabetes mellitus (HCC)   . Unspecified hypothyroidism   . Other and unspecified hyperlipidemia   . Coronary artery disease   . BPH (benign prostatic hypertrophy)      Prior to Admission medications   Medication Sig Start Date End Date Taking? Authorizing Provider  aspirin 81 MG tablet Take 81 mg by mouth daily.   Yes Historical Provider, MD  fish oil-omega-3 fatty acids 1000 MG capsule Take 2 g by mouth daily.   Yes Historical Provider, MD  gabapentin (NEURONTIN) 100 MG capsule TAKE 1 CAPSULE BY MOUTH AT BEDTIME. 08/11/15  Yes Mihai Croitoru, MD  Insulin Glargine (LANTUS SOLOSTAR) 100 UNIT/ML Solostar Pen INJECT 0.4 MLS (40 UNITS TOTAL) INTO THE SKIN DAILY "NEEDS OFFICE VISIT" 11/02/15  Yes Ahonesty Woodfin, PA-C  insulin lispro (HUMALOG KWIKPEN) 100 UNIT/ML KiwkPen INJECT 10 UNITS TOTAL INTO THE SKIN DAILY BEFORE SUPPER.  "OV NEEDED" 07/07/15  Yes Wallis BambergMario Mani, PA-C  Insulin Pen Needle 31G X 5 MM MISC Use as directed 04/03/14  Yes Jonita Albeehris W Guest, MD  L-Methylfolate-B6-B12 (FOLTANX PO) Take by mouth.   Yes Historical Provider, MD  levothyroxine (SYNTHROID, LEVOTHROID) 150 MCG tablet Take 1 tablet (150 mcg total) by mouth daily. 11/05/14  Yes Maurice MarchBarbara B McPherson, MD  metFORMIN (GLUCOPHAGE) 1000 MG tablet TAKE 1 TABLET BY MOUTH 2 TIMES DAILY 11/17/15  Yes Lanier ClamNicole Bush V, PA-C  Multiple Vitamin (MULTIVITAMIN) tablet Take 1 tablet by mouth daily.   Yes Historical Provider, MD  nebivolol (BYSTOLIC) 5 MG tablet TAKE 1 TABLET (  TOTAL) BY MOUTH DAILY 11/05/14  Yes Maurice March, MD  rosuvastatin (CRESTOR) 40 MG tablet TAKE 1 TABLE BY MOUTH DAILY 11/05/14  Yes Maurice March, MD  sildenafil (REVATIO) 20 MG tablet Take 2- 3 tablets by mouth prior to sexual activity. Limit use to once a day. 04/05/15  Yes Maurice March, MD  VOLTAREN 1 % GEL  10/08/15   Historical Provider, MD     Allergies  Allergen Reactions  . Morphine And Related Nausea Only       Objective:    Physical Exam  Constitutional: He is oriented to person, place, and time. Vital signs are normal. He appears well-developed and well-nourished. He is active and cooperative. No distress.  BP 123/81 mmHg  Pulse 58  Temp(Src) 97.1 F (36.2 C) (Oral)  Resp 16  Ht  (1.88 m)  Wt 257 lb (116.574 kg)  BMI 32.98 kg/m2  SpO2 97%  HENT:  Head: Normocephalic and atraumatic.  Right Ear: Hearing normal.  Left Ear: Hearing normal.  Eyes: Conjunctivae are normal. No scleral icterus.  Neck: Normal range of motion. Neck supple. No thyromegaly present.  Cardiovascular: Normal rate, regular rhythm and normal heart sounds.   Pulses:      Radial pulses are 2+ on the right side, and 2+ on the left side.  Pulmonary/Chest: Effort normal and breath sounds normal.  Lymphadenopathy:       Head (right side): No tonsillar, no preauricular, no posterior auricular and no occipital adenopathy present.       Head (left side): No tonsillar, no preauricular, no posterior auricular and no occipital adenopathy present.    He has no cervical adenopathy.       Right: No supraclavicular adenopathy present.       Left: No supraclavicular adenopathy present.  Neurological: He is alert and oriented to person, place, and time. He has normal strength. No sensory deficit.  Skin: Skin is warm, dry and intact. No rash noted. No cyanosis or erythema. Nails show no clubbing.  Psychiatric: He has a normal mood and affect. His speech is normal and behavior is normal.           Assessment & Plan:   1. Type 2 diabetes mellitus without complication, with long-term current use of insulin (HCC) Await lab results. Adjust medication if indicated. Discussed healthier eating choices. Continue great physical activities. - POCT glucose (manual entry) - POCT glycosylated hemoglobin (Hb A1C) - CBC with Differential/Platelet - Comprehensive metabolic panel - Microalbumin, urine  2. Hyperlipidemia Await lab results. Goal LDL  <70. - Lipid panel  3. Coronary artery disease involving coronary bypass graft of native heart without angina pectoris Continue follow up with cardiology annually, per their recommendation. Continue working on good diabetes control, lipid control and weight loss.  4. Hypothyroidism, unspecified hypothyroidism type Await lab results. Adjust dose if indicated. - Thyroid Panel With TSH  5. Needs flu shot - Flu Vaccine QUAD 36+ mos IM  6. Need for pneumococcal vaccination - Pneumococcal conjugate vaccine 13-valent IM  7. Need for shingles vaccine There is some question as to whether or not he has already received this vaccine. We will communicate with the pharmacy. - zoster vaccine live, PF, (ZOSTAVAX) 16109 UNT/0.65ML injection; Inject 19,400 Units into the skin once.  Dispense: 1 each; Refill: 0    Return in about 3 months (around 02/22/2016).   Fernande Bras, PA-C Physician Assistant-Certified [facility]

## 2015-11-23 NOTE — Progress Notes (Signed)
Subjective:    Patient ID: Nathan Navarro, male    DOB: 1941/06/18, 74 y.o.   MRN: [id number]  Chief Complaint  Patient presents with  . Diabetes    check   . Flu Vaccine  . Medication Refill    all meds    HPI Patient presents today for follow-up of T2DM requiring insulin and medication refills.   Currently on Metformin 1000 mg BID, 40 units of Lantus in the morning, and 10 units of Humalog at dinner. Does not check his sugars at home because he doesn't like pricking his finger everyday. Not on any special diet, states "food is my nemesis!". Weight has been stable over the last year, but would like to lose 50 pounds. Spends an hour in the gym 4 days a week, where he bikes 5 miles, does the elliptical for 1 mile, and lifts weights. Also plays golf 4 days a week and enjoys chasing his 2 grandchildren around, ages 57 and 31.   Sees Cardiology annually for monitoring of CAD. Last seen by Dr. Royann Shivers in May. Currently on gabapentin 100 mg QHS for nighttime peripheral neuropathy related to his T2DM, nebivolol 5 mg daily for HTN, and rosuvastatin 40 mg daily for HLD.   Patient also due for annual flu vaccination, would like to get this today.   Review of Systems  Constitutional: Negative for fever, chills, activity change, appetite change, fatigue and unexpected weight change.  Respiratory: Negative for shortness of breath.   Cardiovascular: Negative for chest pain and palpitations.  Gastrointestinal: Negative for nausea, vomiting, abdominal pain and diarrhea.  Musculoskeletal: Negative for myalgias and back pain.  Neurological: Positive for numbness (diabetic peripheral neuropathy, well-controlled on gabapentin). Negative for dizziness, weakness, light-headedness and headaches.   Patient Active Problem List   Diagnosis Date Noted  . Erectile dysfunction 11/23/2015  . Diabetic peripheral neuropathy (HCC) 11/23/2015  . OA (osteoarthritis) of knee 04/22/2012  . Cough 03/09/2012  .  Diabetes type 2, controlled (HCC)   . Hypothyroidism   . Hyperlipidemia   . Coronary artery disease   . BPH (benign prostatic hypertrophy)    Family History  Problem Relation Age of Onset  . Heart disease Father 44    MI  . Heart disease Brother 42    MI  . Heart disease Brother 32    MI  . Hyperlipidemia Son    Social History   Social History  . Marital Status: Married    Spouse Name: Aram Beecham  . Number of Children: 2  . Years of Education: N/A   Occupational History  . Not on file.   Social History Main Topics  . Smoking status: Never Smoker   . Smokeless tobacco: Current User    Types: Chew  . Alcohol Use: 0.6 oz/week    1 Shots of liquor per week     Comment: occ  . Drug Use: No  . Sexual Activity: Yes     Comment: number of sex partners in the last 12 months 1   Other Topics Concern  . Not on file   Social History Narrative   Exercise cardio 3 x a week for one hour   Lives with his wife.   Both children are grown and live independently.   Prior to Admission medications   Medication Sig Start Date End Date Taking? Authorizing Provider  aspirin 81 MG tablet Take 81 mg by mouth daily.   Yes Historical Provider, MD  fish oil-omega-3 fatty acids 1000 MG  capsule Take 2 g by mouth daily.   Yes Historical Provider, MD  gabapentin (NEURONTIN) 100 MG capsule TAKE 1 CAPSULE BY MOUTH AT BEDTIME. 08/11/15  Yes Mihai Croitoru, MD  Insulin Glargine (LANTUS SOLOSTAR) 100 UNIT/ML Solostar Pen INJECT 0.4 MLS (40 UNITS TOTAL) INTO THE SKIN DAILY "NEEDS OFFICE VISIT" 11/02/15  Yes Chelle Jeffery, PA-C  insulin lispro (HUMALOG KWIKPEN) 100 UNIT/ML KiwkPen INJECT 10 UNITS TOTAL INTO THE SKIN DAILY BEFORE SUPPER.  "OV NEEDED" 07/07/15  Yes Wallis BambergMario Mani, PA-C  Insulin Pen Needle 31G X 5 MM MISC Use as directed 04/03/14  Yes Jonita Albeehris W Guest, MD  L-Methylfolate-B6-B12 (FOLTANX PO) Take by mouth.   Yes Historical Provider, MD  levothyroxine (SYNTHROID, LEVOTHROID) 150 MCG tablet Take 1 tablet  (150 mcg total) by mouth daily. 11/05/14  Yes Maurice MarchBarbara B McPherson, MD  metFORMIN (GLUCOPHAGE) 1000 MG tablet TAKE 1 TABLET BY MOUTH 2 TIMES DAILY 11/17/15  Yes Lanier ClamNicole Bush V, PA-C  Multiple Vitamin (MULTIVITAMIN) tablet Take 1 tablet by mouth daily.   Yes Historical Provider, MD  nebivolol (BYSTOLIC) 5 MG tablet TAKE 1 TABLET (5MG  TOTAL) BY MOUTH DAILY 11/05/14  Yes Maurice MarchBarbara B McPherson, MD  rosuvastatin (CRESTOR) 40 MG tablet TAKE 1 TABLE BY MOUTH DAILY 11/05/14  Yes Maurice MarchBarbara B McPherson, MD  sildenafil (REVATIO) 20 MG tablet Take 2- 3 tablets by mouth prior to sexual activity. Limit use to once a day. 04/05/15  Yes Maurice MarchBarbara B McPherson, MD  VOLTAREN 1 % GEL  10/08/15   Historical Provider, MD   Allergies  Allergen Reactions  . Morphine And Related Nausea Only      Objective:   Physical Exam  Constitutional: He is oriented to person, place, and time. He appears well-developed and well-nourished. No distress.  HENT:  Head: Normocephalic and atraumatic.  Eyes: EOM are normal. No scleral icterus.  Neck: Neck supple.  Cardiovascular: Normal rate, regular rhythm and normal heart sounds.  Exam reveals no gallop and no friction rub.   No murmur heard. 2+ DP and PT pulses b/l.   Pulmonary/Chest: Effort normal and breath sounds normal. No respiratory distress. He has no wheezes. He has no rales.  Lymphadenopathy:    He has no cervical adenopathy.  Neurological: He is alert and oriented to person, place, and time.  Skin: Skin is warm and dry. He is not diaphoretic.  Psychiatric: He has a normal mood and affect. His behavior is normal. Judgment and thought content normal.   BP 123/81 mmHg  Pulse 58  Temp(Src) 97.1 F (36.2 C) (Oral)  Resp 16  Ht 6\' 2"  (1.88 m)  Wt 257 lb (116.574 kg)  BMI 32.98 kg/m2  SpO2 97%     Assessment & Plan:  1. Type 2 diabetes mellitus without complication, with long-term current use of insulin (HCC) - Awaiting lab results. Continue metformin, Lantus, and  Humalog as prescribed.  - POCT glucose (manual entry) - POCT glycosylated hemoglobin (Hb A1C) - CBC with Differential/Platelet - Comprehensive metabolic panel - Microalbumin, urine  2. Hyperlipidemia - Awaiting lab results. Continue rosuvastatin 40 mg daily.  - Lipid panel  3. Coronary artery disease involving coronary bypass graft of native heart without angina pectoris - Continue annual monitoring with Cardiology.  - Encourage continuation of regular exercise and incorporation of heart-healthy diet to reduce the risk of stroke.   4. Hypothyroidism, unspecified hypothyroidism type - Continue levothyroxine 150 mg daily.  - Thyroid Panel With TSH  5. Needs flu shot - Flu Vaccine QUAD 36+  mos IM  6. Need for pneumococcal vaccination - Pneumococcal conjugate vaccine 13-valent IM  7. Need for shingles vaccine - zoster vaccine live, PF, (ZOSTAVAX) 16109 UNT/0.65ML injection; Inject 19,400 Units into the skin once.  Dispense: 1 each; Refill: 0  8. Erectile dysfunction, unspecified erectile dysfunction type - Continue sildenafil 20 mg prn.

## 2015-11-23 NOTE — Patient Instructions (Addendum)
Influenza Virus Vaccine injection What is this medicine? INFLUENZA VIRUS VACCINE (in floo EN zuh VAHY ruhs vak SEEN) helps to reduce the risk of getting influenza also known as the flu. The vaccine only helps protect you against some strains of the flu. This medicine may be used for other purposes; ask your health care provider or pharmacist if you have questions. What should I tell my health care provider before I take this medicine? They need to know if you have any of these conditions: -bleeding disorder like hemophilia -fever or infection -Guillain-Barre syndrome or other neurological problems -immune system problems -infection with the human immunodeficiency virus (HIV) or AIDS -low blood platelet counts -multiple sclerosis -an unusual or allergic reaction to influenza virus vaccine, latex, other medicines, foods, dyes, or preservatives. Different brands of vaccines contain different allergens. Some may contain latex or eggs. Talk to your doctor about your allergies to make sure that you get the right vaccine. -pregnant or trying to get pregnant -breast-feeding How should I use this medicine? This vaccine is for injection into a muscle or under the skin. It is given by a health care professional. A copy of Vaccine Information Statements will be given before each vaccination. Read this sheet carefully each time. The sheet may change frequently. Talk to your healthcare provider to see which vaccines are right for you. Some vaccines should not be used in all age groups. Overdosage: If you think you have taken too much of this medicine contact a poison control center or emergency room at once. NOTE: This medicine is only for you. Do not share this medicine with others. What if I miss a dose? This does not apply. What may interact with this medicine? -chemotherapy or radiation therapy -medicines that lower your immune system like etanercept, anakinra, infliximab, and adalimumab -medicines  that treat or prevent blood clots like warfarin -phenytoin -steroid medicines like prednisone or cortisone -theophylline -vaccines This list may not describe all possible interactions. Give your health care provider a list of all the medicines, herbs, non-prescription drugs, or dietary supplements you use. Also tell them if you smoke, drink alcohol, or use illegal drugs. Some items may interact with your medicine. What should I watch for while using this medicine? Report any side effects that do not go away within 3 days to your doctor or health care professional. Call your health care provider if any unusual symptoms occur within 6 weeks of receiving this vaccine. You may still catch the flu, but the illness is not usually as bad. You cannot get the flu from the vaccine. The vaccine will not protect against colds or other illnesses that may cause fever. The vaccine is needed every year. What side effects may I notice from receiving this medicine? Side effects that you should report to your doctor or health care professional as soon as possible: -allergic reactions like skin rash, itching or hives, swelling of the face, lips, or tongue Side effects that usually do not require medical attention (report to your doctor or health care professional if they continue or are bothersome): -fever -headache -muscle aches and pains -pain, tenderness, redness, or swelling at the injection site -tiredness This list may not describe all possible side effects. Call your doctor for medical advice about side effects. You may report side effects to FDA at 1-800-FDA-1088. Where should I keep my medicine? The vaccine will be given by a health care professional in a clinic, pharmacy, doctor's office, or other health care setting. You will not   be given vaccine doses to store at home. NOTE: This sheet is a summary. It may not cover all possible information. If you have questions about this medicine, talk to your  doctor, pharmacist, or health care provider.    2016, Elsevier/Gold Standard. (2015-07-02 10:07:28) Pneumococcal Vaccine, Polyvalent suspension for injection What is this medicine? PNEUMOCOCCAL VACCINE (NEU mo KOK al vak SEEN) is a vaccine used to prevent pneumococcus bacterial infections. These bacteria can cause serious infections like pneumonia, meningitis, and blood infections. This vaccine will lower your chance of getting pneumonia. If you do get pneumonia, it can make your symptoms milder and your illness shorter. This vaccine will not treat an infection and will not cause infection. This vaccine is recommended for infants and young children, adults with certain medical conditions, and adults 65 years or older. This medicine may be used for other purposes; ask your health care provider or pharmacist if you have questions. What should I tell my health care provider before I take this medicine? They need to know if you have any of these conditions: -bleeding problems -fever -immune system problems -an unusual or allergic reaction to pneumococcal vaccine, diphtheria toxoid, other vaccines, latex, other medicines, foods, dyes, or preservatives -pregnant or trying to get pregnant -breast-feeding How should I use this medicine? This vaccine is for injection into a muscle. It is given by a health care professional. A copy of Vaccine Information Statements will be given before each vaccination. Read this sheet carefully each time. The sheet may change frequently. Talk to your pediatrician regarding the use of this medicine in children. While this drug may be prescribed for children as young as 68 weeks old for selected conditions, precautions do apply. Overdosage: If you think you have taken too much of this medicine contact a poison control center or emergency room at once. NOTE: This medicine is only for you. Do not share this medicine with others. What if I miss a dose? It is important not to  miss your dose. Call your doctor or health care professional if you are unable to keep an appointment. What may interact with this medicine? -medicines for cancer chemotherapy -medicines that suppress your immune function -steroid medicines like prednisone or cortisone This list may not describe all possible interactions. Give your health care provider a list of all the medicines, herbs, non-prescription drugs, or dietary supplements you use. Also tell them if you smoke, drink alcohol, or use illegal drugs. Some items may interact with your medicine. What should I watch for while using this medicine? Mild fever and pain should go away in 3 days or less. Report any unusual symptoms to your doctor or health care professional. What side effects may I notice from receiving this medicine? Side effects that you should report to your doctor or health care professional as soon as possible: -allergic reactions like skin rash, itching or hives, swelling of the face, lips, or tongue -breathing problems -confused -fast or irregular heartbeat -fever over 102 degrees F -seizures -unusual bleeding or bruising -unusual muscle weakness Side effects that usually do not require medical attention (report to your doctor or health care professional if they continue or are bothersome): -aches and pains -diarrhea -fever of 102 degrees F or less -headache -irritable -loss of appetite -pain, tender at site where injected -trouble sleeping This list may not describe all possible side effects. Call your doctor for medical advice about side effects. You may report side effects to FDA at 1-800-FDA-1088. Where should I keep my  medicine? This does not apply. This vaccine is given in a clinic, pharmacy, doctor's office, or other health care setting and will not be stored at home. NOTE: This sheet is a summary. It may not cover all possible information. If you have questions about this medicine, talk to your doctor,  pharmacist, or health care provider.    2016, Elsevier/Gold Standard. (2014-09-17 10:27:27)

## 2015-11-24 ENCOUNTER — Telehealth: Payer: Self-pay | Admitting: *Deleted

## 2015-11-24 ENCOUNTER — Encounter: Payer: Self-pay | Admitting: Physician Assistant

## 2015-11-24 LAB — MICROALBUMIN, URINE: MICROALB UR: 1.4 mg/dL

## 2015-11-24 NOTE — Telephone Encounter (Signed)
Called spoke to patient, advised him Shingles vaccine was administered to him on 05/30/2011 by the pharmacy. [facility] and Isabella BowensMichelle F will abstract, per Chelle.

## 2015-11-24 NOTE — Telephone Encounter (Signed)
Doran Duranddvised Cynthia of message from patient.

## 2015-11-25 ENCOUNTER — Encounter: Payer: Self-pay | Admitting: Family Medicine

## 2015-12-01 ENCOUNTER — Other Ambulatory Visit: Payer: Self-pay | Admitting: Physician Assistant

## 2015-12-01 ENCOUNTER — Other Ambulatory Visit: Payer: Self-pay | Admitting: Internal Medicine

## 2015-12-25 ENCOUNTER — Ambulatory Visit (INDEPENDENT_AMBULATORY_CARE_PROVIDER_SITE_OTHER): Payer: Medicare Other | Admitting: Family Medicine

## 2015-12-25 VITALS — BP 118/74 | HR 69 | Temp 98.4°F | Resp 18 | Ht 73.0 in | Wt 255.0 lb

## 2015-12-25 DIAGNOSIS — R05 Cough: Secondary | ICD-10-CM | POA: Diagnosis not present

## 2015-12-25 DIAGNOSIS — J069 Acute upper respiratory infection, unspecified: Secondary | ICD-10-CM | POA: Diagnosis not present

## 2015-12-25 DIAGNOSIS — J019 Acute sinusitis, unspecified: Secondary | ICD-10-CM

## 2015-12-25 DIAGNOSIS — R059 Cough, unspecified: Secondary | ICD-10-CM

## 2015-12-25 MED ORDER — HYDROCODONE-HOMATROPINE 5-1.5 MG/5ML PO SYRP: 5.0000 mL | ORAL_SOLUTION | ORAL | Status: DC | PRN

## 2015-12-25 MED ORDER — FLUTICASONE PROPIONATE 50 MCG/ACT NA SUSP: 2.0000 | Freq: Every day | NASAL | Status: DC

## 2015-12-25 MED ORDER — BENZONATATE 100 MG PO CAPS: ORAL_CAPSULE | ORAL | Status: DC

## 2015-12-25 MED ORDER — AMOXICILLIN-POT CLAVULANATE 875-125 MG PO TABS: 1.0000 | ORAL_TABLET | Freq: Two times a day (BID) | ORAL | Status: DC

## 2015-12-25 NOTE — Progress Notes (Signed)
Patient ID: Nathan Navarro, male    DOB: 10/02/1941  Age: 74 y.o. MRN: [id number]  Chief Complaint  Patient presents with  . URI    x1 week    Subjective:   74 year old retired man who takes care of his grandkids often and plays golf. He feels like he picks up a respiratory tract infections from the grandkids. He has been having problems with a cold for about a week. Is now settled into his chest and is coughing up phlegm. He does not smoke. He does not have any fever. He does have pressure in his sinus areas of the face. No ear pain. No sore throat. He is diabetic, fair control.  Current allergies, medications, problem list, past/family and social histories reviewed.  Objective:  BP 118/74 mmHg  Pulse 69  Temp(Src) 98.4 F (36.9 C) (Oral)  Resp 18  Ht 6\' 1"  (1.854 m)  Wt 255 lb (115.667 kg)  BMI 33.65 kg/m2  SpO2 95%  No major distress. His TMs are normal. No major sinus tenderness. Throat clear. Neck supple without nodes. Chest is clear to auscultation. Heart regular without murmurs.  Assessment & Plan:   Assessment: 1. Acute upper respiratory infection   2. Acute sinusitis, recurrence not specified, unspecified location   3. Cough       Plan: This up respiratory infection seems to have settled into a more persistent sinusitis/bronchitis problem. Will give her a round of antibiotics. Also will treat for the cough and congestion.   Meds ordered this encounter  Medications  . amoxicillin-clavulanate (AUGMENTIN) 875-125 MG tablet    Sig: Take 1 tablet by mouth 2 (two) times daily.    Dispense:  20 tablet    Refill:  0  . fluticasone (FLONASE) 50 MCG/ACT nasal spray    Sig: Place 2 sprays into both nostrils daily.    Dispense:  16 g    Refill:  0  . HYDROcodone-homatropine (HYCODAN) 5-1.5 MG/5ML syrup    Sig: Take 5 mLs by mouth every 4 (four) hours as needed.    Dispense:  120 mL    Refill:  0  . benzonatate (TESSALON PERLES) 100 MG capsule    Sig: Take 1-2 pills  3 times daily if needed for daytime cough    Dispense:  20 capsule    Refill:  0         Patient Instructions  Drink plenty of fluids and get enough rest  Take the antibiotic, Augmentin (amoxicillin/clavulanate) one twice daily at breakfast and supper  Use the fluticasone nose spray 2 sprays each nostril twice daily for 3 days, then once daily to open up the sinuses  Take the Hycodan cough syrup 1 teaspoon every 4-6 hours if needed for nighttime cough. This tends to be sedating  You can use the benzonatate cough pills one or 2 pills 3 times daily as needed for daytime cough  No labs or x-rays are indicated today, but if you're getting worse please return because we might have to check things out further.  Continue to be careful with your diabetic and medications     Return if symptoms worsen or fail to improve.   HOPPER,DAVID, MD 12/25/2015

## 2015-12-25 NOTE — Patient Instructions (Signed)
Drink plenty of fluids and get enough rest  Take the antibiotic, Augmentin (amoxicillin/clavulanate) one twice daily at breakfast and supper  Use the fluticasone nose spray 2 sprays each nostril twice daily for 3 days, then once daily to open up the sinuses  Take the Hycodan cough syrup 1 teaspoon every 4-6 hours if needed for nighttime cough. This tends to be sedating  You can use the benzonatate cough pills one or 2 pills 3 times daily as needed for daytime cough  No labs or x-rays are indicated today, but if you're getting worse please return because we might have to check things out further.  Continue to be careful with your diabetic and medications

## 2016-02-03 ENCOUNTER — Other Ambulatory Visit: Payer: Self-pay

## 2016-02-03 ENCOUNTER — Telehealth: Payer: Self-pay

## 2016-02-03 DIAGNOSIS — E039 Hypothyroidism, unspecified: Secondary | ICD-10-CM

## 2016-02-03 DIAGNOSIS — I25810 Atherosclerosis of coronary artery bypass graft(s) without angina pectoris: Secondary | ICD-10-CM

## 2016-02-03 MED ORDER — ROSUVASTATIN CALCIUM 40 MG PO TABS: ORAL_TABLET | ORAL | Status: AC

## 2016-02-03 MED ORDER — NEBIVOLOL HCL 5 MG PO TABS: ORAL_TABLET | ORAL | Status: DC

## 2016-02-03 MED ORDER — LEVOTHYROXINE SODIUM 150 MCG PO TABS: 150.0000 ug | ORAL_TABLET | Freq: Every day | ORAL | Status: AC

## 2016-02-03 NOTE — Telephone Encounter (Signed)
nebivolol (BYSTOLIC) 5 MG tablet

## 2016-02-14 ENCOUNTER — Other Ambulatory Visit: Payer: Self-pay | Admitting: Physician Assistant

## 2016-03-06 ENCOUNTER — Other Ambulatory Visit: Payer: Self-pay | Admitting: Urgent Care

## 2016-03-06 NOTE — Telephone Encounter (Signed)
Refill sent electronically. Please have patient follow up in May or June 2017.

## 2016-03-06 NOTE — Telephone Encounter (Signed)
Please advise. He does not have a dose for tonight

## 2016-03-22 ENCOUNTER — Other Ambulatory Visit: Payer: Self-pay | Admitting: Physician Assistant

## 2016-04-01 ENCOUNTER — Other Ambulatory Visit: Payer: Self-pay | Admitting: Physician Assistant

## 2016-04-06 ENCOUNTER — Other Ambulatory Visit: Payer: Self-pay | Admitting: Physician Assistant

## 2016-05-01 ENCOUNTER — Other Ambulatory Visit: Payer: Self-pay

## 2016-05-01 MED ORDER — SILDENAFIL CITRATE 20 MG PO TABS: ORAL_TABLET | ORAL | Status: DC

## 2016-05-01 NOTE — Telephone Encounter (Signed)
Pharm sent req for RF of sildenafil. Chelle, do you want to OK RF or have pt RTC first? Last seen for check up 10/2015.

## 2016-05-01 NOTE — Telephone Encounter (Signed)
I've authorized 1 fill, but patient needs OV for next fill.  Meds ordered this encounter  Medications  . sildenafil (REVATIO) 20 MG tablet    Sig: Take 2- 3 tablets by mouth prior to sexual activity. Limit use to once a day.    Dispense:  50 tablet    Refill:  0

## 2016-05-01 NOTE — Telephone Encounter (Signed)
Notified pt of RF and need for f/up. He thanked us but advised that he has switched PCP to [facility] [place] [facility].

## 2016-07-31 ENCOUNTER — Other Ambulatory Visit: Payer: Self-pay | Admitting: Physician Assistant

## 2016-07-31 DIAGNOSIS — E039 Hypothyroidism, unspecified: Secondary | ICD-10-CM

## 2017-05-11 ENCOUNTER — Other Ambulatory Visit: Payer: Self-pay | Admitting: Physician Assistant

## 2017-08-10 ENCOUNTER — Ambulatory Visit: Payer: Medicare Other | Admitting: Cardiovascular Disease

## 2017-08-14 ENCOUNTER — Encounter: Payer: Self-pay | Admitting: Cardiovascular Disease

## 2017-08-14 ENCOUNTER — Ambulatory Visit (INDEPENDENT_AMBULATORY_CARE_PROVIDER_SITE_OTHER): Payer: Medicare Other | Admitting: Cardiovascular Disease

## 2017-08-14 VITALS — BP 132/78 | HR 45 | Ht 74.0 in | Wt 258.2 lb

## 2017-08-14 DIAGNOSIS — T50905A Adverse effect of unspecified drugs, medicaments and biological substances, initial encounter: Secondary | ICD-10-CM | POA: Diagnosis not present

## 2017-08-14 DIAGNOSIS — E1142 Type 2 diabetes mellitus with diabetic polyneuropathy: Secondary | ICD-10-CM | POA: Diagnosis not present

## 2017-08-14 DIAGNOSIS — R001 Bradycardia, unspecified: Secondary | ICD-10-CM | POA: Diagnosis not present

## 2017-08-14 DIAGNOSIS — E7800 Pure hypercholesterolemia, unspecified: Secondary | ICD-10-CM | POA: Diagnosis not present

## 2017-08-14 DIAGNOSIS — N528 Other male erectile dysfunction: Secondary | ICD-10-CM | POA: Diagnosis not present

## 2017-08-14 DIAGNOSIS — Z794 Long term (current) use of insulin: Secondary | ICD-10-CM | POA: Diagnosis not present

## 2017-08-14 DIAGNOSIS — I25810 Atherosclerosis of coronary artery bypass graft(s) without angina pectoris: Secondary | ICD-10-CM

## 2017-08-14 DIAGNOSIS — I10 Essential (primary) hypertension: Secondary | ICD-10-CM | POA: Insufficient documentation

## 2017-08-14 MED ORDER — LISINOPRIL 10 MG PO TABS: 10.0000 mg | ORAL_TABLET | Freq: Every day | ORAL | 3 refills | Status: DC

## 2017-08-14 MED ORDER — NEBIVOLOL HCL 2.5 MG PO TABS: 2.5000 mg | ORAL_TABLET | Freq: Every day | ORAL | 3 refills | Status: DC

## 2017-08-14 NOTE — Progress Notes (Signed)
Cardiology Office Note:    Date:  08/14/2017   ID:  Heshy Aftab, DOB 04/08/1941, MRN [id number]  PCP:  Dois Davenport, MD  Cardiologist:  Thurmon Fair, MD    Referring MD: Porfirio Oar, PA-C   Chief Complaint  Patient presents with  . Follow-up  CAD s/p CABG  History of Present Illness:    Nathan Navarro is a 76 y.o. male with a hx of CAD with initial bypass surgery 1981 and redo bypass in 2000 (LIMA to LAD, RIMA to RCA, SVG to diagonal, SVG to ramus intermedius), mild obesity, type 2 diabetes mellitus on insulin, hyperlipidemia, hypertension, hypothyroidism and erectile dysfunction. He has mild asymptomatic left ventricular systolic dysfunction (EF 45%) and his most recent functional study in 2012 showed normal perfusion. His ECG stress test shows 1. 5-2 millimeter ST segment depression at peak exercise even with normal perfusion pattern.  He feels great. He continues to go to the gym and exercise 3 days a week, mostly on the bicycle and elliptical but also some light weights. He denies any problems with exertional angina or dyspnea. His heart rate "tops out" out at about 85 bpm, but he denies fatigue, dizziness or syncope. He also plays golf 3 days a week and is generally very active.  His blood pressure was a little high when he saw his PCP and lisinopril 5 mg once daily was added. His blood pressure today is great.  He denies palpitations, leg edema, claudication, but still has problems with erectile dysfunction, with only moderate benefit from use of sildenafil. He reports labs performed roughly a week ago with Dr. Hal Hope but I don't have those reports yet.  Past Medical History:  Diagnosis Date  . Arthritis   . BPH (benign prostatic hypertrophy)   . Coronary artery disease   . History of stress test 12/15/2011   The post stress myocardial perfusion images show a normal pattern of perfusion in all regins. The post-stress EF is 45%. Global left ventricular systolic  function is mildly reduced. Exercise capacity 9 METS, Exercise capacity is normal. ECG positive for ischemia.. Normal myocardial Perfusion Study.  Marland Kitchen Hx of echocardiogram 09/10/2006   EF 50-55%. The left ventricle is normal in size. Left ventricular systolic function is low normal. Cannot r/o mild hypokinesis of the entire anterior anterolateral and septal wall. Septal motion consistent with post operative state. Poor echo window.  . Obesity   . Other and unspecified hyperlipidemia   . Sleep apnea 10 yrs ago   mild sleep apnea no cpap needed  . Type 2 diabetes mellitus (HCC)   . Unspecified hypothyroidism     Past Surgical History:  Procedure Laterality Date  . CARDIAC CATHETERIZATION  11/25/1998   Multivessel negative coronary artery disease. 100% occlusion of the proximal left anterior descending. Greater than 75% stenosis of an optional circumflex. 80% stenosis of proximal right coronary artery.Patent sequential saphenous vein graft to diagonal and left anterior descending.   Marland Kitchen CARDIAC CATHETERIZATION  07/27/1991   Good ventricular systolic function with an EFof 63% and mild anterolateral wall hypokinesis. Suspected occlusion of the proximal LAD with patent vein graft to the diagonal branch but closure of the vein graft to the LAD. Occlusion of the vein graft to the optional diagonal or intermedius ramus branch  . CORONARY ARTERY BYPASS GRAFT  1981 x 3, 2000 repair 2 and  did 2 more   the second procedurenin 2000(LIMA to LAD, RIMA to right coronary artery, SVG to diagonal, SVG to ramus  intermedius)  . JOINT REPLACEMENT     LEFT TOTAL KNEE 03/2012  . KNEE ARTHROSCOPY  2009   R knee  . KNEE CLOSED REDUCTION  07/01/2012   Procedure: CLOSED MANIPULATION KNEE;  Surgeon: Loanne Drilling, MD;  Location: [facility];  Service: Orthopedics;  Laterality: Left;  . LUMBAR SPINE SURGERY  1986  . TOTAL KNEE ARTHROPLASTY  04/22/2012   Procedure: TOTAL KNEE ARTHROPLASTY;  Surgeon: Loanne Drilling, MD;  Location:  [facility];  Service: Orthopedics;  Laterality: Left;  . TOTAL KNEE ARTHROPLASTY  09/16/2012   Procedure: TOTAL KNEE ARTHROPLASTY;  Surgeon: Loanne Drilling, MD;  Location: [facility];  Service: Orthopedics;  Laterality: Right;    Current Medications: Current Meds  Medication Sig  . aspirin 81 MG tablet Take 81 mg by mouth daily.  . B-D UF III MINI PEN NEEDLES 31G X 5 MM MISC USE AS DIRECTED  . benzonatate (TESSALON PERLES) 100 MG capsule Take 1-2 pills 3 times daily if needed for daytime cough  . fish oil-omega-3 fatty acids 1000 MG capsule Take 2 g by mouth daily.  . fluticasone (FLONASE) 50 MCG/ACT nasal spray Place 2 sprays into both nostrils daily.  Marland Kitchen gabapentin (NEURONTIN) 100 MG capsule TAKE 1 CAPSULE BY MOUTH AT BEDTIME.  . insulin lispro (HUMALOG KWIKPEN) 100 UNIT/ML KiwkPen Inject 0.1 mLs (10 Units total) into the skin daily before supper.  Marland Kitchen LANTUS SOLOSTAR 100 UNIT/ML Solostar Pen INJECT 40 UNITS INTO THE SKIN DAILY. **NEEDS OFFICE VISIT  . levothyroxine (SYNTHROID, LEVOTHROID) 150 MCG tablet Take 1 tablet (150 mcg total) by mouth daily.  . metFORMIN (GLUCOPHAGE) 1000 MG tablet TAKE 1 TABLET BY MOUTH 2 TIMES DAILY. NEED OFFICE VISIT FOR FURTHER REFILLS  . Multiple Vitamin (MULTIVITAMIN) tablet Take 1 tablet by mouth daily.  . nebivolol (BYSTOLIC) 5 MG tablet TAKE 1 TABLET (5MG  TOTAL) BY MOUTH DAILY  . rosuvastatin (CRESTOR) 40 MG tablet Lisinopril 5 mg tablet TAKE 1 TABLE BY MOUTH DAILY  TAKE 1 TABLE BY MOUTH DAILY  . sildenafil (REVATIO) 20 MG tablet Take 2- 3 tablets by mouth prior to sexual activity. Limit use to once a day.  . VOLTAREN 1 % GEL   . zoster vaccine live, PF, (ZOSTAVAX) 56213 UNT/0.65ML injection Inject 19,400 Units into the skin once.     Allergies:   Morphine and related   Social History   Social History  . Marital status: Married    Spouse name: Aram Beecham  . Number of children: 2  . Years of education: N/A   Social History Main Topics  . Smoking status:  Never Smoker  . Smokeless tobacco: Current User    Types: Chew  . Alcohol use 0.6 oz/week    1 Shots of liquor per week     Comment: occ  . Drug use: No  . Sexual activity: Yes     Comment: number of sex partners in the last 12 months 1   Other Topics Concern  . None   Social History Narrative   Exercise cardio 3 x a week for one hour   Lives with his wife.   Both children are grown and live independently.     Family History: The patient's family history includes Heart disease (age of onset: 3) in his brother; Heart disease (age of onset: 31) in his brother and father; Hyperlipidemia in his son. ROS:   Please see the history of present illness.     All other systems reviewed and are negative.  EKGs/Labs/Other Studies Reviewed:    EKG:  EKG is ordered today.  The ekg ordered today demonstrates Sinus bradycardia with second-degree sinoatrial block Mobitz type I and an average ventricular rate of 45 bpm. No repolarization abnormalities are seen other than isolated subtle T-wave inversion in lead aVL.  Recent Labs: No results found for requested labs within last 8760 hours.  Recent Lipid Panel    Component Value Date/Time   CHOL 121 (L) 11/23/2015 1020   TRIG 79 11/23/2015 1020   HDL 37 (L) 11/23/2015 1020   CHOLHDL 3.3 11/23/2015 1020   VLDL 16 11/23/2015 1020   LDLCALC 68 11/23/2015 1020    Physical Exam:    VS:  BP 132/78   Pulse (!) 45   Ht 6\' 2"  (1.88 m)   Wt 258 lb 3.2 oz (117.1 kg)   BMI 33.15 kg/m     Wt Readings from Last 3 Encounters:  08/14/17 258 lb 3.2 oz (117.1 kg)  12/25/15 255 lb (115.7 kg)  11/23/15 257 lb (116.6 kg)     GEN: Very muscular and athletic for his age, but also mildly obese. Well nourished, well developed in no acute distress HEENT: Normal NECK: No JVD; No carotid bruits LYMPHATICS: No lymphadenopathy CARDIAC: Bradycardia, RRR, no murmurs, rubs, gallops RESPIRATORY:  Clear to auscultation without rales, wheezing or rhonchi    ABDOMEN: Soft, non-tender, non-distended MUSCULOSKELETAL:  No edema; No deformity  SKIN: Warm and dry NEUROLOGIC:  Alert and oriented x 3 PSYCHIATRIC:  Normal affect   ASSESSMENT:    1. Coronary artery disease involving coronary bypass graft of native heart without angina pectoris   2. Essential hypertension   3. Drug-induced bradycardia   4. Pure hypercholesterolemia   5. Controlled type 2 diabetes mellitus with diabetic polyneuropathy, with long-term current use of insulin (HCC)   6. Other male erectile dysfunction    PLAN:    In order of problems listed above:  1. CAD s/p redo CABG: Asymptomatic, normal perfusion pattern at last functional study in 2012. Continue with risk factor modification. Congratulated on his frequent exercise. Encouraged to lose some weight. 2. HTN: Excellent control, but a little worried about bradycardia. Will cut his bystolic in half and double his lisinopril dose. 3. Bradycardia: Cut back the beta blocker dose even though he is asymptomatic. 4. Hyperlipidemia: Retrieve labs from PCP. Continue statin. 5. DM: Good control per his report. It labs. Encouraged weight loss. 6. ED: He can increase the dose of sildenafil up to a total of 5 of the 20 mg tablets as needed. If this is ineffective may need to see his urologist for alternative treatments. Cutting back the beta blocker may have some benefit.   Medication Adjustments/Labs and Tests Ordered: Current medicines are reviewed at length with the patient today.  Concerns regarding medicines are outlined above.  Orders Placed This Encounter  Procedures  . EKG 12-Lead   Meds ordered this encounter  Medications  . nebivolol (BYSTOLIC) 2.5 MG tablet    Sig: Take 1 tablet (2.5 mg total) by mouth daily.    Dispense:  90 tablet    Refill:  3  . lisinopril (PRINIVIL,ZESTRIL) 10 MG tablet    Sig: Take 1 tablet (10 mg total) by mouth daily.    Dispense:  90 tablet    Refill:  3    Signed, Thurmon Fair,  MD  08/14/2017 9:18 AM    [facility]

## 2017-08-14 NOTE — Patient Instructions (Signed)
Dr Royann Shivers has recommended making the following medication changes: 1. DECREASE Bystolic to 2.5 mg daily 2. INCREASE Lisinopril to 10 mg daily  Your physician recommends that you schedule a follow-up appointment in 12 months. You will receive a reminder letter in the mail two months in advance. If you don't receive a letter, please call our office to schedule the follow-up appointment.  If you need a refill on your cardiac medications before your next appointment, please call your pharmacy.

## 2018-06-04 ENCOUNTER — Other Ambulatory Visit: Payer: Self-pay | Admitting: Family Medicine

## 2018-06-04 DIAGNOSIS — R1011 Right upper quadrant pain: Secondary | ICD-10-CM

## 2018-06-04 DIAGNOSIS — R1012 Left upper quadrant pain: Secondary | ICD-10-CM

## 2018-08-06 ENCOUNTER — Other Ambulatory Visit: Payer: Self-pay | Admitting: Cardiovascular Disease

## 2018-08-06 DIAGNOSIS — I25810 Atherosclerosis of coronary artery bypass graft(s) without angina pectoris: Secondary | ICD-10-CM

## 2019-01-13 ENCOUNTER — Other Ambulatory Visit: Payer: Self-pay | Admitting: Cardiovascular Disease

## 2019-01-13 NOTE — Telephone Encounter (Signed)
Rx has been sent to the pharmacy electronically. ° °

## 2019-02-11 ENCOUNTER — Other Ambulatory Visit: Payer: Self-pay | Admitting: Cardiovascular Disease

## 2019-02-25 ENCOUNTER — Other Ambulatory Visit: Payer: Self-pay | Admitting: Cardiovascular Disease

## 2019-02-28 ENCOUNTER — Ambulatory Visit: Payer: Medicare Other | Admitting: Cardiovascular Disease

## 2019-02-28 ENCOUNTER — Encounter: Payer: Self-pay | Admitting: Cardiovascular Disease

## 2019-02-28 ENCOUNTER — Encounter: Payer: Self-pay | Admitting: *Deleted

## 2019-02-28 VITALS — BP 120/72 | HR 54 | Ht 74.0 in | Wt 256.4 lb

## 2019-02-28 DIAGNOSIS — E7800 Pure hypercholesterolemia, unspecified: Secondary | ICD-10-CM

## 2019-02-28 DIAGNOSIS — I25810 Atherosclerosis of coronary artery bypass graft(s) without angina pectoris: Secondary | ICD-10-CM | POA: Diagnosis not present

## 2019-02-28 DIAGNOSIS — I10 Essential (primary) hypertension: Secondary | ICD-10-CM | POA: Diagnosis not present

## 2019-02-28 DIAGNOSIS — Z006 Encounter for examination for normal comparison and control in clinical research program: Secondary | ICD-10-CM

## 2019-02-28 DIAGNOSIS — E669 Obesity, unspecified: Secondary | ICD-10-CM

## 2019-02-28 DIAGNOSIS — N522 Drug-induced erectile dysfunction: Secondary | ICD-10-CM

## 2019-02-28 DIAGNOSIS — E1169 Type 2 diabetes mellitus with other specified complication: Secondary | ICD-10-CM

## 2019-02-28 DIAGNOSIS — I455 Other specified heart block: Secondary | ICD-10-CM | POA: Diagnosis not present

## 2019-02-28 MED ORDER — LISINOPRIL 20 MG PO TABS: 20.0000 mg | ORAL_TABLET | Freq: Every day | ORAL | 3 refills | Status: DC

## 2019-02-28 NOTE — Patient Instructions (Signed)
Medication Instructions:  Stop Bystolic Increase Lisinopril to 20 mg daily If you need a refill on your cardiac medications before your next appointment, please call your pharmacy.   Lab work: None ordered   Testing/Procedures: None ordered  Follow-Up: At [facility], you and your health needs are our priority.  As part of our continuing mission to provide you with exceptional heart care, we have created designated Provider Care Teams.  These Care Teams include your primary Cardiologist (physician) and Advanced Practice Providers (APPs -  Physician Assistants and Nurse Practitioners) who all work together to provide you with the care you need, when you need it. . Follow up appointment with Dr.Croitoru in 6 months   Call 3 months before to schedule

## 2019-02-28 NOTE — Progress Notes (Signed)
Cardiology Office Note:    Date:  03/01/2019   ID:  Nathan Marishomas [place]feuille, DOB 09/10/1941, MRN [id number]  PCP:  Dois Davenportichter, Karen L, MD  Cardiologist:  Thurmon FairMihai Eneida Evers, MD    Referring MD: Dois Davenportichter, Karen L, MD   Chief Complaint  Patient presents with  . Coronary Artery Disease  CAD s/p CABG  History of Present Illness:    Nathan Navarro is a 78 y.o. male with a hx of CAD with initial bypass surgery 1981 and redo bypass in 2000 (LIMA to LAD, RIMA to RCA, SVG to diagonal, SVG to ramus intermedius), mild obesity, type 2 diabetes mellitus on insulin, hyperlipidemia, hypertension, hypothyroidism and erectile dysfunction. He has mild asymptomatic left ventricular systolic dysfunction (EF 45%) and his most recent functional study in 2012 showed normal perfusion. His ECG stress test shows 1. 5-2 millimeter ST segment depression at peak exercise even with normal perfusion pattern.  He has no cardiovascular complaints, no angina or dyspnea despite a very active lifestyle.  [facility] a week (different bicycle, elliptical, 40-50 pound weights, 30-50 situps). He feels great.  As before he reports that he always has a small heart rate at rest and that his heart rate rarely exceeds 85 bpm and exercise.  Denies dizziness, lightheadedness, syncope, palpitations or focal neurological complaints.  Labs performed by Dr. Nadyne CoombesKaren Richter on January 22 show a hemoglobin A1c of 7.3% and a surprisingly high TSH of 67.4.  He is on levothyroxine supplements.  His basic metabolic panel was normal.  I did not get a copy of his most recent lipid profile.  Target LDL is less than 70.  He is aware of the possibility of renewed coronary problems, 20 years after his redo bypass procedure and specifically asked today what we can do to improve outcomes, even though he is asymptomatic.  Past Medical History:  Diagnosis Date  . Arthritis   . BPH (benign prostatic hypertrophy)   . Coronary artery  disease   . History of stress test 12/15/2011   The post stress myocardial perfusion images show a normal pattern of perfusion in all regins. The post-stress EF is 45%. Global left ventricular systolic function is mildly reduced. Exercise capacity 9 METS, Exercise capacity is normal. ECG positive for ischemia.. Normal myocardial Perfusion Study.  Marland Kitchen. Hx of echocardiogram 09/10/2006   EF 50-55%. The left ventricle is normal in size. Left ventricular systolic function is low normal. Cannot r/o mild hypokinesis of the entire anterior anterolateral and septal wall. Septal motion consistent with post operative state. Poor echo window.  . Obesity   . Other and unspecified hyperlipidemia   . Sleep apnea 10 yrs ago   mild sleep apnea no cpap needed  . Type 2 diabetes mellitus (HCC)   . Unspecified hypothyroidism     Past Surgical History:  Procedure Laterality Date  . CARDIAC CATHETERIZATION  11/25/1998   Multivessel negative coronary artery disease. 100% occlusion of the proximal left anterior descending. Greater than 75% stenosis of an optional circumflex. 80% stenosis of proximal right coronary artery.Patent sequential saphenous vein graft to diagonal and left anterior descending.   Marland Kitchen. CARDIAC CATHETERIZATION  07/27/1991   Good ventricular systolic function with an EFof 63% and mild anterolateral wall hypokinesis. Suspected occlusion of the proximal LAD with patent vein graft to the diagonal branch but closure of the vein graft to the LAD. Occlusion of the vein graft to the optional diagonal or intermedius ramus branch  . CORONARY ARTERY BYPASS  GRAFT  1981 x 3, 2000 repair 2 and  did 2 more   the second procedurenin 2000(LIMA to LAD, RIMA to right coronary artery, SVG to diagonal, SVG to ramus intermedius)  . JOINT REPLACEMENT     LEFT TOTAL KNEE 03/2012  . KNEE ARTHROSCOPY  2009   R knee  . KNEE CLOSED REDUCTION  07/01/2012   Procedure: CLOSED MANIPULATION KNEE;  Surgeon: Loanne Drilling, MD;   Location: [facility];  Service: Orthopedics;  Laterality: Left;  . LUMBAR SPINE SURGERY  1986  . TOTAL KNEE ARTHROPLASTY  04/22/2012   Procedure: TOTAL KNEE ARTHROPLASTY;  Surgeon: Loanne Drilling, MD;  Location: [facility];  Service: Orthopedics;  Laterality: Left;  . TOTAL KNEE ARTHROPLASTY  09/16/2012   Procedure: TOTAL KNEE ARTHROPLASTY;  Surgeon: Loanne Drilling, MD;  Location: [facility];  Service: Orthopedics;  Laterality: Right;    Current Medications: Current Meds  Medication Sig  . aspirin 81 MG tablet Take 81 mg by mouth daily.  . B-D UF III MINI PEN NEEDLES 31G X 5 MM MISC USE AS DIRECTED  . benzonatate (TESSALON PERLES) 100 MG capsule Take 1-2 pills 3 times daily if needed for daytime cough  . fish oil-omega-3 fatty acids 1000 MG capsule Take 2 g by mouth daily.  . fluticasone (FLONASE) 50 MCG/ACT nasal spray Place 2 sprays into both nostrils daily.  Marland Kitchen gabapentin (NEURONTIN) 100 MG capsule TAKE 1 CAPSULE BY MOUTH AT BEDTIME.  . insulin lispro (HUMALOG KWIKPEN) 100 UNIT/ML KiwkPen Inject 0.1 mLs (10 Units total) into the skin daily before supper.  Marland Kitchen LANTUS SOLOSTAR 100 UNIT/ML Solostar Pen INJECT 40 UNITS INTO THE SKIN DAILY. **NEEDS OFFICE VISIT  . levothyroxine (SYNTHROID, LEVOTHROID) 150 MCG tablet Take 1 tablet (150 mcg total) by mouth daily.  . metFORMIN (GLUCOPHAGE) 1000 MG tablet TAKE 1 TABLET BY MOUTH 2 TIMES DAILY. NEED OFFICE VISIT FOR FURTHER REFILLS  . Multiple Vitamin (MULTIVITAMIN) tablet Take 1 tablet by mouth daily.  . nebivolol (BYSTOLIC) 5 MG tablet TAKE 1 TABLET (5MG  TOTAL) BY MOUTH DAILY  . rosuvastatin (CRESTOR) 40 MG tablet Lisinopril 5 mg tablet TAKE 1 TABLE BY MOUTH DAILY  TAKE 1 TABLE BY MOUTH DAILY  . sildenafil (REVATIO) 20 MG tablet Take 2- 3 tablets by mouth prior to sexual activity. Limit use to once a day.  . VOLTAREN 1 % GEL   . zoster vaccine live, PF, (ZOSTAVAX) 03491 UNT/0.65ML injection Inject 19,400 Units into the skin once.     Allergies:   Morphine  and related   Social History   Socioeconomic History  . Marital status: Married    Spouse name: Aram Beecham  . Number of children: 2  . Years of education: Not on file  . Highest education level: Not on file  Occupational History  . Not on file  Social Needs  . Financial resource strain: Not on file  . Food insecurity:    Worry: Not on file    Inability: Not on file  . Transportation needs:    Medical: Not on file    Non-medical: Not on file  Tobacco Use  . Smoking status: Never Smoker  . Smokeless tobacco: Current User    Types: Chew  Substance and Sexual Activity  . Alcohol use: Yes    Alcohol/week: 1.0 standard drinks    Types: 1 Shots of liquor per week    Comment: occ  . Drug use: No  . Sexual activity: Yes    Comment: number  of sex partners in the last 12 months 1  Lifestyle  . Physical activity:    Days per week: Not on file    Minutes per session: Not on file  . Stress: Not on file  Relationships  . Social connections:    Talks on phone: Not on file    Gets together: Not on file    Attends religious service: Not on file    Active member of club or organization: Not on file    Attends meetings of clubs or organizations: Not on file    Relationship status: Not on file  Other Topics Concern  . Not on file  Social History Narrative   Exercise cardio 3 x a week for one hour   Lives with his wife.   Both children are grown and live independently.     Family History: The patient's family history includes Heart disease (age of onset: 56) in his brother; Heart disease (age of onset: 108) in his brother and father; Hyperlipidemia in his son. ROS:   Please see the history of present illness.     All other systems reviewed and are negative.  EKGs/Labs/Other Studies Reviewed:    EKG:  EKG is ordered today.  His electrocardiogram again shows sinus bradycardia with second-degree Mobitz type I block and a junctional escape beat, but no acute repolarization  abnormalities. Recent Labs: No results found for requested labs within last 8760 hours.  Recent Lipid Panel    Component Value Date/Time   CHOL 121 (L) 11/23/2015 1020   TRIG 79 11/23/2015 1020   HDL 37 (L) 11/23/2015 1020   CHOLHDL 3.3 11/23/2015 1020   VLDL 16 11/23/2015 1020   LDLCALC 68 11/23/2015 1020    Physical Exam:    VS:  BP 120/72   Pulse (!) 54   Ht  (1.88 m)   Wt 256 lb 6.4 oz (116.3 kg)   BMI 32.92 kg/m     Wt Readings from Last 3 Encounters:  02/28/19 256 lb 6.4 oz (116.3 kg)  08/14/17 258 lb 3.2 oz (117.1 kg)  12/25/15 255 lb (115.7 kg)    General: Alert, oriented x3, no distress, he is remarkably muscular and strong for his age but is also mildly obese.  No phone number on the bottle and call with you exactly what you can find out well or leaving on Sunday 7 what this is a [place] Head: no evidence of trauma, PERRL, EOMI, no exophtalmos or lid lag, no myxedema, no xanthelasma; normal ears, nose and oropharynx Neck: normal jugular venous pulsations and no hepatojugular reflux; brisk carotid pulses without delay and no carotid bruits Chest: clear to auscultation, no signs of consolidation by percussion or palpation, normal fremitus, symmetrical and full respiratory excursions Cardiovascular: normal position and quality of the apical impulse, regular rhythm, normal first and second heart sounds, no murmurs, rubs or gallops Abdomen: no tenderness or distention, no masses by palpation, no abnormal pulsatility or arterial bruits, normal bowel sounds, no hepatosplenomegaly Extremities: no clubbing, cyanosis or edema; 2+ radial, ulnar and brachial pulses bilaterally; 2+ right femoral, posterior tibial and dorsalis pedis pulses; 2+ left femoral, posterior tibial and dorsalis pedis pulses; no subclavian or femoral bruits Neurological: grossly nonfocal Psych: Normal mood and affect   ASSESSMENT:    1. Coronary artery disease involving coronary bypass graft of  native heart without angina pectoris   2. Essential hypertension   3. SA block   4. Hypercholesteremia   5. Diabetes mellitus  type 2 in obese (HCC)   6. Drug-induced erectile dysfunction    PLAN:    In order of problems listed above:  1. CAD s/p redo CABG: He specifically asks about prevention of future coronary events, since he is worried that 20 years have passed since his previous blood press procedure.  I congratulated him on his commitment to regular exercise which should give [place] early warning when he develops problems.  He remains obese and clearly needs to work on his diet to improve his weight and his glycemic control.  He is on the maximum dose of a potent statin.  I do not have his most recent lipid profile, but we will request it from Dr. Hal Hope.  Potential ways to improve his outcome long-term would be the use of SGLT2 inhibitor such as Jardiance or Farxiga (rather than insulin or sulfonylurea medications), also considering Vascepa, especially if his triglycerides are abnormal. 2. HTN: Excellent control, but will stop his beta-blocker due to bradycardia and increase the ACE inhibitor dose. 3. Bradycardia: He continues to have bradycardia and junctional escape beats despite reducing his dose of beta-blocker.  We will stop it altogether.  To compensate for expected increase in blood pressure will increase his lisinopril to 20 mg once daily. 4. Hyperlipidemia: Retrieve labs from PCP. Continue statin. 5. DM:  needs to improve his diet and recommit to weight loss.  I think he is an excellent candidate for SGLT2 inhibitors and these may help him with his weight, compared with chronic insulin use.  He is interested in participating in the COORDINATE trial and talked to our research nurses today.  Of course, we will coordinate with his primary care provider regarding his diabetes medications. 6. ED: Stopping the beta blocker may have some benefit.   Medication Adjustments/Labs and Tests  Ordered: Current medicines are reviewed at length with the patient today.  Concerns regarding medicines are outlined above.  Orders Placed This Encounter  Procedures  . EKG 12-Lead   Meds ordered this encounter  Medications  . lisinopril (PRINIVIL,ZESTRIL) 20 MG tablet    Sig: Take 1 tablet (20 mg total) by mouth daily.    Dispense:  90 tablet    Refill:  3    Signed, Thurmon Fair, MD  03/01/2019 10:38 AM     [facility]

## 2019-02-28 NOTE — Research (Signed)
  COORDINATE-Diabetes BASELINE CASE REPORT FORM (Intervention) Site #:  161              Patient ID: [id number]             INCLUSION CRITERIA  1. Is patient 18 years or older? '[]'$ No   '[x]'$ Yes  2. Based on the clinical record, does the patient have a history of type 2 diabetes mellitus? '[]'$ No   '[x]'$ Yes  3. Based on the clinical record, does the patient have a history of atherosclerotic cardiovascular disease, as defined by at least one of the clinical criteria below? '[]'$ No   '[x]'$ Yes   IF YES: Coronary Artery Disease Prior myocardial infarction Prior coronary artery bypass graft surgery Prior percutaneous coronary intervention Documented obstructive (i.e. ?50%) coronary artery disease (by angiography or CT) '[x]'$ No  '[]'$ Yes '[]'$ No  '[x]'$ Yes '[x]'$ No  '[]'$ Yes '[x]'$ No  '[]'$ Yes    Cerebrovascular Disease Prior ischemic stroke Carotid artery stenosis (?50%) '[x]'$ No '[]'$  Yes '[x]'$ No '[]'$  Yes    Peripheral Arterial Disease Prior peripheral revascularization Prior amputation due to poor circulation History of Claudication with Ankle-brachial index <0.9 '[x]'$ No '[]'$  Yes '[x]'$ No '[]'$  Yes '[x]'$ No '[]'$  Yes I  4. Patient's baseline guideline-based therapy score:    1 point Currently prescribed angiotensin converting enzyme inhibitor (ACEi), angiotensin receptor blocker (ARB) or Angiotensin Receptor Neprilysin inhibitor (ARNi) therapy '[]'$ No '[x]'$  Yes   1 point Currently prescribed high intensity statin therapy (atorvastatin 40-'80mg'$  daily OR rosuvastatin 20-'40mg'$  daily) '[]'$ No '[x]'$  Yes   1 point Most recent HbA1c <7% on metformin monotherapy? '[x]'$ No '[]'$  Yes    Calculated score: 2    EXCLUSION CRITERIA  REVIEW EACH CONDITION AND SELECT YES IF THE STATEMENT IS TRUE OR NO IF THE STATEMENT IS FALSE.  1. Determined to be highly unlikely to survive and/or to continue follow-up in that clinic for at least 1 year, as identified by site investigator '[x]'$ No '[]'$ Yes  2. eGFR ? 30 ml/min/1.60m '[x]'$ No '[]'$ Yes  3. Baseline composite score of 3 for guideline  recommended therapy '[x]'$ No '[]'$ Yes  4. Absolute contraindication to any of the 3 guideline recommended therapies    ACEi AND ARB therapy '[x]'$ No '[]'$  Yes    High intensity statin therapy '[x]'$ No  '[]'$ Yes    SGLT2I AND GLP1RA therapy '[x]'$ No  '[]'$ Yes   5. Already taking SGLT2i or GLP1RA therapy at baseline '[x]'$ No '[]'$ Yes     Subject Name: Nathan Navarro Subject met inclusion and exclusion criteria.  The informed consent form, study requirements and expectations were reviewed with the subject and questions and concerns were addressed prior to the signing of the consent form.  The subject verbalized understanding of the trial requirements.  The subject agreed to participate in the [facility] Diabetes trial and signed the informed consent on 02/28/19 at 10:45.The informed consent was obtained prior to performance of any protocol-specific procedures for the subject.  A copy of the signed informed consent was given to the subject and a copy was placed in the subject's medical record.   JBerneda Rose        '[]'$ No '[x]'$ Yes  Subject educated on Coordinate diabetes and all educational materials given to subject.

## 2019-03-01 DIAGNOSIS — I455 Other specified heart block: Secondary | ICD-10-CM | POA: Insufficient documentation

## 2019-03-04 ENCOUNTER — Other Ambulatory Visit: Payer: Self-pay

## 2019-03-04 MED ORDER — LISINOPRIL 20 MG PO TABS: 20.0000 mg | ORAL_TABLET | Freq: Every day | ORAL | 3 refills | Status: DC

## 2019-03-04 NOTE — Telephone Encounter (Signed)
Rx(s) sent to pharmacy electronically.  

## 2019-06-04 ENCOUNTER — Telehealth: Payer: Self-pay | Admitting: *Deleted

## 2019-06-04 NOTE — Telephone Encounter (Addendum)
Marland Kitchen.Marland Kitchen.Marland Kitchen.Marland Kitchen.Marland Kitchen.Marland Kitchen.Marland Kitchen.   COORDINATE-Diabetes 3 Month Patient Questionnaire (Intervention) Site #:   204   Patient ID:    [id number]               3 MONTH QUESTIONNAIRE  Visit Date       06/04/2019       MM DD YYYY  Vital Status [x]   Patient Alive > Proceed to Visit Status []   Patient Dead > Complete Death Form only []   Unknown > Proceed to Visit Status  Visit Status Was the interview completed? []  No >IF NO, Select reason why [] Unable to locate                                    [] No Valid Contacts (patients or alternates) [] Multiple attempts to valid contacts []  Patient no longer cared for at study clinic []  Patient withdrew []  Other, specify:                                           >IF NO, Last date of contact: / /             MM      DD        YYYY    [x] Yes >IF YES, Select source of Interview: []   Proxy [x]   Patient     3 Month Patient Questionnaire (Intervention)  Instructions:   1. Prior to speaking with the patient either over the phone or in person, print off or have available the patient's current list of medications.      IF conducting follow-up visit over the phone - Instruct the patient to gather all their pill bottles. 2. For the purpose of the COORDINATE-Diabetes Study, please document whether the patient is currently taking each of the following medication classes. 3. DO NOT PROMPT DURING FOLLOW-UP VISIT: IF subject mentions reaction to one of the following medications, complete the AE/SAE section and follow instructions in operations manual regarding Adverse event reporting to [employer] ([facility]).  MEDICATIONS  Medication Are you currently taking [Lisinopril]? If currently taking: If started since last visit: If stopped since last visit:  ACE Inhibitor / Angiotensin Receptor Blocker (ARB) / Angiotensin Receptor Neprilysin inhibitor (ARNi) [] No >  [x] Yes > [] Stopped since last visit [] Never prescribed [] Started since last visit [x] Same medication as last      visit [] Different medication than       last visit [Study personnel to answer]  Documented in EHR? [] No  [x] Yes   If no, Verified by: [] Photo of bottle [] Copy of rx [] Dispensing       pharmacy [] Prescribing provider [] Other (specify):                    Date started:        / /             MM DD YYYY Who prescribed this medication for you? [] Cardiology provider > [] Study clinic [] Outside clinic [] Endocrinology provider [] Primary care provider [] Other provider > Specify:                             [] Unknown Date discontinued:        / /  MM DD YYYY Why did you stop taking this medication? (check all that apply) [] Allergic reaction [] Medication side effects [] Unable to       adhere/monitor [] Had an      operation/procedure      that required      stopping it [] Unable to afford it [] No longer wants        to take        this medication [] Provider decision [] Pregnancy [] Other (specify: ) [] Unknown Reason   If started or changed  What medication are you taking now? [] Benazepril (Lotensin) [] Captopril (Capoten) [] Enalapril (Vasotec) [] Fosinopril (Monopril) [] Lisinopril (Zestril, Prinivil) [] Quinapril (Accupril) [] Ramipril (Altace) [] Azilsartan (Edarbi) [] Candesartan (Atacand) [] Irbesartan (Avapro) [] Losartan (Cozaar) [] Olmesartan (Benicar) [] Telmisartan (Micardis) [] Valsartan (Diovan) [] Sacrubitril/Valsartan Sherryll Burger(Entresto)      Medication Are you currently taking [Rosustatin]? If currently taking: If started since last visit: If stopped since last visit:  Statin []  No >  [x] Yes > [] Stopped since last visit [] Never prescribed [] Started since last visit [x] Same medication as last        visit [] Different medication or dose     than last visit [Study personnel to answer]  Documented in EHR? [] No [x] Yes    If no, Verified by: [] Photo of bottle [] Copy of rx [] Dispensing pharmacy [] Prescribing provider [] Other (specify):                     Date started:        / /             MM DD YYYY Who prescribed this medication for you? [] Cardiology provider  [] Study clinic [] Outside clinic [] Endocrinology provider [] Primary care provider [] Other provider  Specify:                             [] Unknown Date discontinued:        / /             MM DD YYYY Why did you stop taking this medication? (check all that apply) [] Allergic reaction [] Medication side effects  [] Muscle aches [] Weakness [] Joint pain [] Cognitive symptoms [] Other (specify):                          [] Unable to        adhere/monitor [] Had an    operation/procedure    that required stopping it [] Unable to afford it [] No longer wants       to take this            medication [] Provider decision [] Pregnancy [] Other (specify: ) [] Unknown Reason   If started or changed  What medication are you taking now? [] Atorvastatin (Lipitor) [] Fluvastatin (Lescol) [] Lovastatin (Mevacor) [] Pravastatin (Pravachol) [] Rosuvastatin (Crestor) [] Simvastatin (Zocor) [] Pitatavastatin (Livalo)  Dose: [] 1 mg  [] 10 mg [] 2 mg  []  20 mg [] 3 mg  []  40 mg [] 4 mg  []  60 mg [] 5 mg  []  80 mg  Frequency: [] Daily []  Less than daily      Medication Are you currently taking? If currently taking: If started since last visit: If stopped since last visit:  SGLT2 Inhibitor [] No   [x] Yes [] Stopped since last visit [] Never prescribed [x] Started since last visit [] Same medication as last      visit [] Different medication than last visit [Study personnel to answer]  Documented in EHR? [x] No [] Yes   If no, Verified by: [] Photo of bottle [] Copy of rx [] Dispensing  pharmacy [] Prescribing       provider [] Other (specify):                    Date started:    03 /09/2019          MM DD YYYY Who prescribed this medication for you? [x] Cardiology provider  [] Study clinic [] Outside clinic [] Endocrinology provider [] Primary care  provider [] Other provider  Specify:                             [] Unknown Date discontinued:        / /             MM DD YYYY Why did you stop taking this medication? (check all that apply) [] Allergic reaction [] Medication side effects [] Unable to       adhere/monitor [] Had an        operation/procedure         that required           stopping it [] Patient unable to       afford it [] Patient no longer       wants to       take this medication [] Provider decision [] Pregnancy [] Other (specify: ) [] Unknown Reason   If started or changed  What medication are you taking now? [] Canaglifozin (Invokana) [] Dapagliflozin (Farxiga) [x] Empaglifozin (Jardiance) [] Ertugliflozin (Steglatro)     GLP1 Receptor Agonist [x] No   [] Yes [] Stopped since last visit [x] Never prescribed [] Started since last visit [] Same medication as last      visit [] Different medication than       last visit [Study personnel to answer]  Documented in EHR? [] No [] Yes   If no, Verified by: [] Photo of bottle [] Copy of rx [] Dispensing       pharmacy [] Prescribing       provider [] Other (specify):                    Date started:        / /             MM DD YYYY Who prescribed this medication for you? [] Cardiology provider  [] Study clinic [] Outside clinic [] Endocrinology provider [] Primary care provider [] Other provider  Specify:                             [] Unknown Date discontinued:        / /             MM DD YYYY Why did you stop taking this medication? (check all that apply) [] Allergic reaction [] Medication side effects [] Unable to      adhere/monitor [] Had an    operation/procedure    that required stopping it [] Patient unable to        afford it [] Patient no longer        wants to take this        medication [] Provider decision [] Pregnancy [] Other (specify: ) [] Unknown Reason   If started or changed  What medication are you taking now? [] Albiglutide  (Tanzeum) [] Dulaglutide (Trulicity) [] Exanatide (Byetta, Bydureon) [] Liraglutide (Victoza, Saxenda) [] Lixisenatide (Adlyxin) [] Semaglutice (Ozempic)     03/04/2019 (EDC)   Coordinate 3 month visit. Pt doing well with no complaints.

## 2019-09-29 ENCOUNTER — Encounter: Payer: Self-pay | Admitting: *Deleted

## 2019-09-29 DIAGNOSIS — Z006 Encounter for examination for normal comparison and control in clinical research program: Secondary | ICD-10-CM

## 2019-09-29 NOTE — Research (Signed)
COORDINATE-Diabetes 6 Month CASE REPORT FORM (Intervention) -EHR Site #:   [id number]              Patient ID:       [id number]         6 MONTH EHR REVIEW  Medical Record Check Date _0 @ MM DD YYYY  Vital Status _1 Patient Alive >Date last known alive per EHR:   _2 Patient Dead >> Complete Death Form  _3 Unknown   CLINICAL EVENTS / PROCEDURES  Hospitalization since last visit? (>=24 hour stay) _4 No _5 Yes  >> if yes, Complete the following  Date of hospital admission:        / /             MM DD YYYY  Primary discharge diagnosis:  *Complete appropriate event validation form _6 acute myocardial infarction (heart attack)* _7 stroke* _8 heart failure* _9 coronary revascularization* _10 peripheral revascularization* _11 cerebral revascularization* _12 diabetes (e.g. hypoglycemia, DKA) _13 renal failure _14 amputation _15 other cardiovascular reason _16 other NON-cardiovascular reason _17 unknown  Other diagnoses not documented above: (check all that apply)  *Complete appropriate event validation form _18 acute myocardial infarction (heart attack)* _19 stroke* _20 heart failure* _21 coronary revascularization* _22 peripheral revascularization* _23 cerebral revascularization* _24 diabetes (e.g. hypoglycemia, DKA) _25 renal failure _26 amputation _27 other cardiovascular reason _28 other NON-cardiovascular reason _29 unknown  Were any of the following outpatient procedures done since the last visit? (I.e. procedures not captured above)  Coronary revascularization _30 No _31 Yes >IF YES, Date / /             MM DD YYYY  Peripheral revascularization _32 No _33 Yes > IF YES, Date / /             MM DD YYYY  Cerebral revascularization _34 No _35 Yes > IF YES, Date / /             MM DD YYYY  Extremity amputation    _36 No _37 Yes >IF YES, Date / /             MM      DD       YYYY  Renal replacement therapy (i.e. dialysis)    _38 No _39 Yes > IF YES, Date of initiation / /             MM      DD       YYYY   **EDC will allow for  collection of multiple hospitalizations and procedures   MEDICATIONS  Medication Currently Prescribed? Lisinopril 20 mg Daily  If started since last visit: If not started since last visit: If stopped since last visit:  Cardiac Medications  ACE Inhibitor / Angiotensin Receptor Blocker (ARB) / Angiotensin Receptor Neprilysin inhibitor (ARNi)  _40 No >  _41 Yes > Since last visit, medication was: _42 Stopped _43 Not started _44 Started _45 Continued same medication _46 Continued with medication changes Date started:        / /             MM DD YYYY Who prescribed? _47 Cardiology provider  _48 Study clinic _49 Outside clinic _50 Endocrinology provider _51 Primary care provider _52 Other provider  Specify:                             _53 Unknown Reason (check all that apply): _54 History of swelling around lips, eyes or face _55 Feeling dizzy/lightheaded _56 Low blood pressure _57 Poor or fluctuating kidney function _58 High potassium _59 Patient has experienced other side effects to this medication  before _60 Patient will be unable to adhere/monitor _61 Patient unable to afford it _62 Patient does not want to  take this medication _0 Pregnancy _1 Other (specify: ) _2 Unknown Reason Date discontinued:        / /             MM DD YYYY Reason (check all that apply): _3 Swelling around lips, eyes       or face _4 Feeling dizzy/lightheaded _5 Low blood pressure _6 Poor or fluctuating kidney function _7 High potassium _8 Other medication side       effects _9 Patient unable to        adhere/monitor _10 Had an       operation/procedure that        required stopping it _11 Patient unable to afford it _12 Patient no longer wants       to take this medication _13 Pregnancy _14 Other (specify: ) _15 Unknown Reason   If started or changed  Medication Name: _16 Benazepril (Lotensin) _17 Captopril (Capoten) _18 Enalapril (Vasotec) _19 Fosinopril (Monopril) _20 Lisinopril (Zestril, Prinivil) _21 Quinapril (Accupril) _22 Ramipril  (Altace) _23 Azilsartan (Edarbi) _24 Candesartan (Atacand) _25 Irbesartan (Avapro) _26 Losartan (Cozaar) _27 Olmesartan (Benicar) _28 Telmisartan (Micardis) _29 Valsartan (Diovan) _30 Sacrubitril/Valsartan (Entresto)     Beta Blocker _31 No _32  Yes > _33 Acebutolol (Sectral) _34 Bisoprolol (Zebeta) _35 Carvedilol (Coreg) _36 Labetalol (Trandate,      Normodyne) _37 Metoprolol succinate (Toprol) _38 Metoprolol tartrate       (Lopressor) _39 Nadolol (Corgard) _40 Nebivolol (Bystolic) <[id number]>_8<\/QPYPPJKDTOIZTIWP>_80 Propranolol (Inderal) _42 Sotalol (Betapace)     Medication Currently Prescribed? If started since last visit: If not started since last visit: If stopped since last visit:  Aldosterone Antagonist _43 No _44 Yes > _45 Amiloride _46 Eplerenone (Inspra) _47 Spirinolactone (Aldactone) _48 Traimterene (Dyrenium)   Calcium Channel Blocker _49 No _50  Yes > Medication Name: _51 Amlodipine (Norvasc) _52 Diltiazem (Cardizem) _53 Felodipine (Plendil) _54 Nifedipine (Procardia) _55 Verapamil (Calan)   Diuretic Loop _56 No _57  Yes > Medication Name: _58 Bumetanide (Bumex) _59 Ethacrynic acid (Edecrin) _60 Furosemide (Lasix) _61 Torsemide (Demadex)   Diuretic Thiazide- type _62 No _63  Yes > Medication Name: _64 Chlorothiazide      _65 Chlorthalidone _66 Hydrochlorothiazide _67 Indapamide _68 Metolazone   Anticoagulation Therapy (other than Warfarin) _69 No _70  Yes > Medication Name: _71 Apixaban (Eliquis) _72 Edoxaban (Lixiana) _73 Rivaroxaban Alen Blew) _74 Dabigatran (Redaxa)   Warfarin _75 No _76  Yes    Antiplatelet Agent (including aspirin) _77 No _78  Yes > Medication Name (check all that apply): _79 Aspirin _80 Clopidogrel (Plavix) _81 Prasugrel (Effient) _82 Ticagrelor (Brilinta) _83 Ticlopidine (Ticlid) _84 Dipyridamole (Persantine)    Medication Currently Prescribed? Rosuvastatin 40 mg Daily If started since last visit: If not started since last visit: If stopped since last visit:  Statin  _85 No >  _86 Yes > Since last visit, medication  was: _87 Stopped _88 Not started _89 Started _90 Continued same medication and dose _91 Continued with dose or medication changes Date started:        / /             MM DD YYYY Who prescribed? _92 Cardiology provider _93 Endocrinology provider _94 Primary care provider _95 Other provider  Specify:                             _96 Unknown Reason (check all that apply): _97 History of Rhabdomyolysis _98 LDL-cholesterol already       <70 _99 Muscle      aches/pain/weakness _100 Mental       fogginess/memory loss _101 Liver dysfunction _102 Patient has  experienced other side   effects to this medication before _103 Patient will be unable to adhere/monitor _104 Patient unable to afford it _105 Patient does not want to take this medication _106 Pregnancy _107 Other (specify: ) _108 Unknown Reason Date discontinued:        / /             MM DD YYYY Reason (check all that apply): _109 Rhabdomyolysis _110 Muscle aches/pain/weakness _111 Mental fogginess/memory loss _112   Liver dysfunction _0 Other medication side effects _1 Patient unable to          adhere/monitor _2 Patient unable to afford it _3 Patient no longer wants to take       this medication _4 Pregnancy _5 Other (specify: ) _6 Unknown Reason   If started or changed  Medication Name: _7 Atorvastatin (Lipitor) _8 Fluvastatin (Lescol) _9 Lovastatin (Mevacor) _10 Pravastatin (Pravachol) _11 Rosuvastatin (Crestor) _12 Simvastatin (Zocor) _13 Pitatavastatin (Livalo)  Dose: _14 1 mg _15 10 mg _16 2 mg _17  20 mg _18 3 mg _19  40 mg _20 4 mg _21  60 mg _22 5 mg _23  80 mg  Frequency: _24 Daily  _25 Less than daily      Does the patient have statin intolerance that prevents the use of maximum dose of high potency statin? _26 No _27 Yes >IF YES, Complete Statin Intolerance form   Non-statin lipid lowering therapy _28 No _29 Yes > Medication Name (check all that apply): _30 Colesevelam (Welchol) _31 Ezetimibe (Zetia) _32 Fibrate _33 Niacin _34 PCSK9 inhibitor _35 Omega 3 acid ethyl esters (Lovaza) _36 Icosapent Ethyl  (Vascepa) _37 Over the counter omega 3 fatty acid or fish oil supplement     Medication Currently Prescribed? If started since last visit: If not started since last visit: If stopped since last visit:  Diabetes Medications  SGLT2 Inhibitor  _38 No >  _39 Yes > Since last visit, medication was: _40 Stopped _41 Not started _42 Started _43 Continued same medication _44 Continued with medication changes Date started:        / /             MM DD YYYY Who prescribed? _45 Cardiology provider  _46 Study clinic _47 Outside clinic _48 Endocrinology      provider _49 Primary care provider _50 Other provider  Specify:                             _51 Unknown Reason (check all that apply): _52 eGFR <45 _53 HbA1c<7% on metformin monotherapy OR already on GLP1RA and do not need to start another anti- hyperglycemic _54 Already dehydrated _55 Low blood pressure _56 High risk of Hypoglycemia _57 Prior DKA _58 Recurrent mycotic genital infections _59 History of or at risk for amputation _60 Patient has experienced other side effects to this medication before _61 Patient will be unable to adhere/monitor _62 Patient unable to afford it _63 Patient does not want to take this medication _64 Pregnancy _65 Other (specify: ) _66 Unknown Reason Date discontinued:        / /             MM DD YYYY Reason (check all that apply  _67 eGFR now <45 _68 Dehydration _69 Low blood pressure _70 Hypoglycemia _71 DKA _72 Mycotic genital infection _73 Amputation _74 Other medication side effects _75 Patient unable    to adhere/monitor  _76 Had an operation/procedure     that required stopping it _77 Patient unable to afford it _78 Patient no longer wants to      take this medication _79 Pregnancy _80 Other (specify: ) _81 Unknown Reason   If started or changed  Medication Name: _82 Canaglifozin (Invokana) _83 Dapagliflozin Wilder Glade) _84 Empaglifozin (Jardiance) _85 Ertugliflozin Actuary)           Medication Currently Prescribed? If started since last visit: If  not started since last visit: If stopped since last visit:  GLP1 Receptor Agonist  _86 No >  _87 Yes > Since last visit, medication was: _88 Stopped _89 Not started _90 Started _91 Continued same medication _92 Continued with medication changes Date started:        / /             MM DD YYYY Who prescribed? _93 Cardiology provider  _94 Study clinic _95 Outside clinic _96 Endocrinology       provider _97 Primary care provider _98 Other provider > Specify:                             _99   Unknown Reason (check all that apply): _0 Personal or family history of medullary thyroid cancer _1 MEN2 _2 HbA1c<7% on metformin monotherapy OR already on SGLT2i and do not need to start another anti-hyperglycemic _3 eGFR now <30 _4 High risk of Hypoglycemia _5 History of pancreatitis _6 Significant gastroparesis _7 Prior gastric surgery _8 Patient has experienced other side effects to this medication before _9 Patient will be unable to adhere/monitor _10 Patient unable to afford it _11 Patient does not want to take this medication _12 Pregnancy _13 Other (specify: ) _14 Unknown Reason Date discontinued:        / /             MM DD YYYY Reason (check all that apply): _15 Medullary thyroid cancer _16 MEN2 _17 eGFR now <30 _18 Hypoglycemia _19 Pancreatitis _20 Significant gastroparesis _21 Gastric surgery _22 Other medication side       effects _23   Patient  unable    to adhere/monitor                                  _24 Had an operation/procedure that required stopping it _25 Patient unable to afford it _26 Patient no longer wants to take this medication _27 Pregnancy _28 Other (specify: ) _29 Unknown Reason   If started or changed > Medication Name: _30 Albiglutide (Tanzeum) _31 Dulaglutide (Trulicity) <WLNLGXQJJHERDEYC>_1<\/KGYJEHUDJSHFWYOV>_78 Exanatide (Byetta, Bydureon) _33 Liraglutide (Victoza, Saxenda) _34 Lixisenatide (Adlyxin) _35 Semaglutice (Ozempic)      Medication Currently Prescribed? If started since last visit: If not started since last visit: If stopped since last  visit:  Other non Insulin diabetes medications _36 No _37 Yes > Medication Name (check all that apply): _38 Acarbose (Precose) _39 Miglitol (Glyset) _40 Glimepiride (Amaryl) _41 Glipizide (Amaryl) _42 Glyburide (Diabeta,       Glynase,   Micronase) _43 Metformin (Fortamet,        Glucophage[including XR],        Glumetza, Riomet) _44 Pioglitazone (Actos) _45 Nateglinide (Starlix) _46 Pramlintide (Symilin) _47 Repaglinide (Prandin) _48 Rosiglitazone (Avandia) _49 Alogliptin (Nesina) _50 Linagliptin (Tradjenta) _51 Saxagliptin (Onglyza) _52 Sitagliptin (Januvia) _53 Bromocriptine Quick Release (Cycloset)     Insulin _54 No _55  Yes > total daily dose: units     STATIN INTOLERANCE (PER EHR/OTHER SOURCE DATA)  1. Was CK checked? _56 No _57 Yes   >If yes, select from the following: _58 CK not elevated _59 CK elevated 1-5x upper limit of normal _60 CK elevated >5x upper limit of normal  2. Does the patient have muscle symptoms? _61 No _62 Yes    >If yes, select from the following: Location and pattern of muscle symptoms (select all that apply) _63 Symmetric, hip flexors or thighs _64 Symmetric, calves _65 Symmetric, proximal upper extremity _66 Asymmetric, intermittent, or not specific to any area _67 Unknown   Timing of muscle symptom in relation to starting statin regimen _68 <4 weeks _69 4-12 weeks _70 >12 weeks _71 Unknown   Timing of muscle symptoms improvement after withdrawal of statin _72 <2 weeks _73 2-4 weeks _74 No improvement after 4 weeks _75 Unknown  3. Was patient re-challenged with a statin regimen (even if same statin compound or regimen as above)?  _76 No  _77 Yes  _78 Unknown  >If yes, select from the following: Timing of recurrence of similar muscle symptoms in relation to starting second regimen _79 <4 weeks _80 4-12 weeks _81 >12 weeks _82 Similar symptoms did not recur _83 Unknown   [email or web address]   Spoke with patient via phone for Coordinate DM 6 month follow up. Pt doing well with no  complaints.Next follow up visit scheduled for December.

## 2019-10-01 ENCOUNTER — Encounter: Payer: Self-pay | Admitting: Cardiovascular Disease

## 2019-11-06 ENCOUNTER — Other Ambulatory Visit: Payer: Self-pay

## 2019-11-06 ENCOUNTER — Ambulatory Visit: Payer: Medicare Other | Admitting: Cardiovascular Disease

## 2019-11-06 ENCOUNTER — Encounter: Payer: Self-pay | Admitting: Cardiovascular Disease

## 2019-11-06 VITALS — BP 116/76 | HR 82 | Temp 97.5°F | Ht 74.0 in | Wt 251.2 lb

## 2019-11-06 DIAGNOSIS — I10 Essential (primary) hypertension: Secondary | ICD-10-CM

## 2019-11-06 DIAGNOSIS — E7800 Pure hypercholesterolemia, unspecified: Secondary | ICD-10-CM

## 2019-11-06 DIAGNOSIS — I25810 Atherosclerosis of coronary artery bypass graft(s) without angina pectoris: Secondary | ICD-10-CM | POA: Diagnosis not present

## 2019-11-06 DIAGNOSIS — I495 Sick sinus syndrome: Secondary | ICD-10-CM

## 2019-11-06 DIAGNOSIS — E669 Obesity, unspecified: Secondary | ICD-10-CM

## 2019-11-06 DIAGNOSIS — E1169 Type 2 diabetes mellitus with other specified complication: Secondary | ICD-10-CM

## 2019-11-06 DIAGNOSIS — N528 Other male erectile dysfunction: Secondary | ICD-10-CM

## 2019-11-06 MED ORDER — LISINOPRIL 10 MG PO TABS: 10.0000 mg | ORAL_TABLET | Freq: Every day | ORAL | 3 refills | Status: AC

## 2019-11-06 MED ORDER — LISINOPRIL 10 MG PO TABS: 10.0000 mg | ORAL_TABLET | Freq: Every day | ORAL | 3 refills | Status: DC

## 2019-11-06 NOTE — Progress Notes (Signed)
Cardiology Office Note:    Date:  11/06/2019   ID:  Nathan Navarro, DOB 1941/11/13, MRN [id number]  PCP:  Nathan Davenport, MD  Cardiologist:  Nathan Fair, MD    Referring MD: Nathan Davenport, MD   Chief Complaint  Patient presents with  . Coronary Artery Disease    s/p CABG 2000  . Hyperlipidemia  . Hypertension  CAD s/p CABG  History of Present Illness:    Nathan Navarro is a 78 y.o. male with a hx of CAD with initial bypass surgery 1981 and redo bypass in 2000 (LIMA to LAD, RIMA to RCA, SVG to diagonal, SVG to ramus intermedius), mild obesity, type 2 diabetes mellitus on insulin, hyperlipidemia, hypertension, hypothyroidism and erectile dysfunction. He has mild asymptomatic left ventricular systolic dysfunction (EF 45%) and his most recent functional study in 2012 showed normal perfusion. His ECG stress test shows 1.5-2 millimeter ST segment depression at peak exercise even with normal perfusion pattern.  His ECG has shown sinus bradycardia and second-degree sinoatrial block so we stopped his beta-blocker.  He has not had symptoms of bradycardia.  Today he is in normal sinus rhythm with first-degree AV block.  He is no longer going to the gym due to the coronavirus pandemic but plays golf 3 days a week and has set up a minute gym in his workshop with weights.  He is planning to buy a stationary bike.  The patient specifically denies any chest pain at rest exertion, dyspnea at rest or with exertion, orthopnea, paroxysmal nocturnal dyspnea, syncope, palpitations, focal neurological deficits, intermittent claudication, lower extremity edema, unexplained weight gain, cough, hemoptysis or wheezing.  He has erectile dysfunction and sildenafil 100 mg does not provide any improvement.  Labs performed by Dr. Nadyne Navarro on January 22 show a hemoglobin A1c of 7.3% and a surprisingly high TSH of 67.4.  He is on levothyroxine supplements.  His basic metabolic panel was normal.  I did  not get a copy of his most recent lipid profile.  Target LDL is less than 70.   Past Medical History:  Diagnosis Date  . Arthritis   . BPH (benign prostatic hypertrophy)   . Coronary artery disease   . History of stress test 12/15/2011   The post stress myocardial perfusion images show a normal pattern of perfusion in all regins. The post-stress EF is 45%. Global left ventricular systolic function is mildly reduced. Exercise capacity 9 METS, Exercise capacity is normal. ECG positive for ischemia.. Normal myocardial Perfusion Study.  Marland Kitchen Hx of echocardiogram 09/10/2006   EF 50-55%. The left ventricle is normal in size. Left ventricular systolic function is low normal. Cannot r/o mild hypokinesis of the entire anterior anterolateral and septal wall. Septal motion consistent with post operative state. Poor echo window.  . Obesity   . Other and unspecified hyperlipidemia   . Sleep apnea 10 yrs ago   mild sleep apnea no cpap needed  . Type 2 diabetes mellitus (HCC)   . Unspecified hypothyroidism     Past Surgical History:  Procedure Laterality Date  . CARDIAC CATHETERIZATION  11/25/1998   Multivessel negative coronary artery disease. 100% occlusion of the proximal left anterior descending. Greater than 75% stenosis of an optional circumflex. 80% stenosis of proximal right coronary artery.Patent sequential saphenous vein graft to diagonal and left anterior descending.   Marland Kitchen CARDIAC CATHETERIZATION  07/27/1991   Good ventricular systolic function with an EFof 63% and mild anterolateral wall hypokinesis. Suspected occlusion of the proximal LAD  with patent vein graft to the diagonal branch but closure of the vein graft to the LAD. Occlusion of the vein graft to the optional diagonal or intermedius ramus branch  . CORONARY ARTERY BYPASS GRAFT  1981 x 3, 2000 repair 2 and  did 2 more   the second procedurenin 2000(LIMA to LAD, RIMA to right coronary artery, SVG to diagonal, SVG to ramus intermedius)  .  JOINT REPLACEMENT     LEFT TOTAL KNEE 03/2012  . KNEE ARTHROSCOPY  2009   R knee  . KNEE CLOSED REDUCTION  07/01/2012   Procedure: CLOSED MANIPULATION KNEE;  Surgeon: Nathan Alf, MD;  Location: [facility];  Service: Orthopedics;  Laterality: Left;  . [place]  . TOTAL KNEE ARTHROPLASTY  04/22/2012   Procedure: TOTAL KNEE ARTHROPLASTY;  Surgeon: Nathan Alf, MD;  Location: [facility];  Service: Orthopedics;  Laterality: Left;  . TOTAL KNEE ARTHROPLASTY  09/16/2012   Procedure: TOTAL KNEE ARTHROPLASTY;  Surgeon: Nathan Alf, MD;  Location: [facility];  Service: Orthopedics;  Laterality: Right;    Current Medications: Current Meds  Medication Sig  . aspirin 81 MG tablet Take 81 mg by mouth daily.  . B-D UF III MINI PEN NEEDLES 31G X 5 MM MISC USE AS DIRECTED  . benzonatate (TESSALON PERLES) 100 MG capsule Take 1-2 pills 3 times daily if needed for daytime cough  . fish oil-omega-3 fatty acids 1000 MG capsule Take 2 g by mouth daily.  . fluticasone (FLONASE) 50 MCG/ACT nasal spray Place 2 sprays into both nostrils daily.  Marland Kitchen gabapentin (NEURONTIN) 100 MG capsule TAKE 1 CAPSULE BY MOUTH AT BEDTIME.  . insulin lispro (HUMALOG KWIKPEN) 100 UNIT/ML KiwkPen Inject 0.1 mLs (10 Units total) into the skin daily before supper.  Marland Kitchen LANTUS SOLOSTAR 100 UNIT/ML Solostar Pen INJECT 40 UNITS INTO THE SKIN DAILY. **NEEDS OFFICE VISIT  . levothyroxine (SYNTHROID, LEVOTHROID) 150 MCG tablet Take 1 tablet (150 mcg total) by mouth daily.  . metFORMIN (GLUCOPHAGE) 1000 MG tablet TAKE 1 TABLET BY MOUTH 2 TIMES DAILY. NEED OFFICE VISIT FOR FURTHER REFILLS  . Multiple Vitamin (MULTIVITAMIN) tablet Take 1 tablet by mouth daily.  . nebivolol (BYSTOLIC) 5 MG tablet TAKE 1 TABLET (5MG  TOTAL) BY MOUTH DAILY  . rosuvastatin (CRESTOR) 40 MG tablet Lisinopril 5 mg tablet TAKE 1 TABLE BY MOUTH DAILY  TAKE 1 TABLE BY MOUTH DAILY  . sildenafil (REVATIO) 20 MG tablet Take 2- 3 tablets by mouth prior to sexual  activity. Limit use to once a day.  . VOLTAREN 1 % GEL   . zoster vaccine live, PF, (ZOSTAVAX) 36144 UNT/0.65ML injection Inject 19,400 Units into the skin once.     Allergies:   Morphine and related   Social History   Socioeconomic History  . Marital status: Married    Spouse name: Caren Griffins  . Number of children: 2  . Years of education: Not on file  . Highest education level: Not on file  Occupational History  . Not on file  Social Needs  . Financial resource strain: Not on file  . Food insecurity    Worry: Not on file    Inability: Not on file  . Transportation needs    Medical: Not on file    Non-medical: Not on file  Tobacco Use  . Smoking status: Never Smoker  . Smokeless tobacco: Current User    Types: Chew  Substance and Sexual Activity  . Alcohol use: Yes  Alcohol/week: 1.0 standard drinks    Types: 1 Shots of liquor per week    Comment: occ  . Drug use: No  . Sexual activity: Yes    Comment: number of sex partners in the last 12 months 1  Lifestyle  . Physical activity    Days per week: Not on file    Minutes per session: Not on file  . Stress: Not on file  Relationships  . Social Musician on phone: Not on file    Gets together: Not on file    Attends religious service: Not on file    Active member of club or organization: Not on file    Attends meetings of clubs or organizations: Not on file    Relationship status: Not on file  Other Topics Concern  . Not on file  Social History Narrative   Exercise cardio 3 x a week for one hour   Lives with his wife.   Both children are grown and live independently.     Family History: The patient's family history includes Heart disease (age of onset: 64) in his brother; Heart disease (age of onset: 31) in his brother and father; Hyperlipidemia in his son. ROS:   Please see the history of present illness.    All other systems are reviewed and are negative.   EKGs/Labs/Other Studies Reviewed:     EKG:  EKG is  ordered today.  Shows normal sinus rhythm with first-degree AV block and normal repolarization pattern Recent Labs: No results found for requested labs within last 8760 hours.  Recent Lipid Panel    Component Value Date/Time   CHOL 121 (L) 11/23/2015 1020   TRIG 79 11/23/2015 1020   HDL 37 (L) 11/23/2015 1020   CHOLHDL 3.3 11/23/2015 1020   VLDL 16 11/23/2015 1020   LDLCALC 68 11/23/2015 1020    Physical Exam:    VS:  BP 116/76   Pulse 82   Temp (!) 97.5 F (36.4 C)   Ht  (1.88 m)   Wt 251 lb 3.2 oz (113.9 kg)   SpO2 95%   BMI 32.25 kg/m     Wt Readings from Last 3 Encounters:  11/06/19 251 lb 3.2 oz (113.9 kg)  02/28/19 256 lb 6.4 oz (116.3 kg)  08/14/17 258 lb 3.2 oz (117.1 kg)    General: Alert, oriented x3, no distress, he is remarkably muscular and strong for his age but is also mildly obese.  No phone number on the bottle and call with you exactly what you can find out well or leaving on Sunday 7 what this is a [place] Head: no evidence of trauma, PERRL, EOMI, no exophtalmos or lid lag, no myxedema, no xanthelasma; normal ears, nose and oropharynx Neck: normal jugular venous pulsations and no hepatojugular reflux; brisk carotid pulses without delay and no carotid bruits Chest: clear to auscultation, no signs of consolidation by percussion or palpation, normal fremitus, symmetrical and full respiratory excursions Cardiovascular: normal position and quality of the apical impulse, regular rhythm, normal first and second heart sounds, no murmurs, rubs or gallops Abdomen: no tenderness or distention, no masses by palpation, no abnormal pulsatility or arterial bruits, normal bowel sounds, no hepatosplenomegaly Extremities: no clubbing, cyanosis or edema; 2+ radial, ulnar and brachial pulses bilaterally; 2+ right femoral, posterior tibial and dorsalis pedis pulses; 2+ left femoral, posterior tibial and dorsalis pedis pulses; no subclavian or femoral bruits  Neurological: grossly nonfocal Psych: Normal mood and affect  ASSESSMENT:    1. Coronary artery disease involving coronary bypass graft of native heart without angina pectoris   2. Essential hypertension   3. SSS (sick sinus syndrome) (HCC)   4. Hypercholesteremia   5. Diabetes mellitus type 2 in obese (HCC)   6. Other male erectile dysfunction    PLAN:    In order of problems listed above:  1. CAD s/p redo CABG: He is physically very active and completely asymptomatic from this point of view.  Congratulated him on his commitment to physical exercise, but he should also try to limit calorie intake since he is obese and has diabetes.   2. HTN: Excellent control.  Beta-blocker stopped due to bradycardia.  I think we can cut back his lisinopril to 10 mg once daily to see if this helps with ED. 3. Bradycardia: First-degree AV block, previously had second-degree AV block.  Avoid agents with chronotropic negative effect. 4. Hyperlipidemia: Still need to get his most recent labs from Dr. Hal Hopeichter. 5. DM: Good candidate for SGLT2 inhibitors.  Enrolled in the COORDINATE trial.  He does not self check fingersticks. 6. ED: Still a problem despite stopping the beta-blocker.  PDE 5 inhibitors were not beneficial.  Suggested discussing this with his urologist.  His blood pressure is excellent.  We will decrease the lisinopril to 10 mg once daily.   Medication Adjustments/Labs and Tests Ordered: Current medicines are reviewed at length with the patient today.  Concerns regarding medicines are outlined above.  Orders Placed This Encounter  Procedures  . EKG 12-Lead   Meds ordered this encounter  Medications  . DISCONTD: lisinopril (ZESTRIL) 10 MG tablet    Sig: Take 1 tablet (10 mg total) by mouth daily.    Dispense:  90 tablet    Refill:  3  . lisinopril (ZESTRIL) 10 MG tablet    Sig: Take 1 tablet (10 mg total) by mouth daily.    Dispense:  90 tablet    Refill:  3    Signed, Nathan FairMihai  Jahon Bart, MD  11/06/2019 8:44 AM    [facility]

## 2019-11-06 NOTE — Patient Instructions (Signed)
Medication Instructions:  DECREASE the Lisinopril to 10 mg once daily  *If you need a refill on your cardiac medications before your next appointment, please call your pharmacy*  Lab Work: None ordered If you have labs (blood work) drawn today and your tests are completely normal, you will receive your results only by: Marland Kitchen MyChart Message (if you have MyChart) OR . A paper copy in the mail If you have any lab test that is abnormal or we need to change your treatment, we will call you to review the results.  Testing/Procedures: None ordered  Follow-Up: At [facility], you and your health needs are our priority.  As part of our continuing mission to provide you with exceptional heart care, we have created designated Provider Care Teams.  These Care Teams include your primary Cardiologist (physician) and Advanced Practice Providers (APPs -  Physician Assistants and Nurse Practitioners) who all work together to provide you with the care you need, when you need it.  Your next appointment:   12 months  The format for your next appointment:   In Person  Provider:   Sanda Klein, MD

## 2020-02-02 ENCOUNTER — Encounter: Payer: Self-pay | Admitting: *Deleted

## 2020-02-02 DIAGNOSIS — Z006 Encounter for examination for normal comparison and control in clinical research program: Secondary | ICD-10-CM

## 2020-02-02 NOTE — Research (Signed)
Marland KitchenMarland KitchenMarland KitchenMarland KitchenMarland KitchenMarland KitchenMarland Kitchen   COORDINATE-Diabetes 9 Month Patient Questionnaire (Intervention) Site #:   938   Patient ID:                   3 MONTH QUESTIONNAIRE  Visit Date 02/02/20   Vital Status [x]   Patient Alive > Proceed to Visit Status []   Patient Dead > Complete Death Form only []   Unknown > Proceed to Visit Status  Visit Status Was the interview completed? []  No >IF NO, Select reason why [] Unable to locate                                    [] No Valid Contacts (patients or alternates) [] Multiple attempts to valid contacts []  Patient no longer cared for at study clinic []  Patient withdrew []  Other, specify:                                           >IF NO, Last date of contact: / /             MM      DD        YYYY    [x] Yes >IF YES, Select source of Interview: []   Proxy [x]   Patient     3 Month Patient Questionnaire (Intervention)  Instructions:   1. Prior to speaking with the patient either over the phone or in person, print off or have available the patient's current list of medications.      IF conducting follow-up visit over the phone - Instruct the patient to gather all their pill bottles. 2. For the purpose of the COORDINATE-Diabetes Study, please document whether the patient is currently taking each of the following medication classes. 3. DO NOT PROMPT DURING FOLLOW-UP VISIT: IF subject mentions reaction to one of the following medications, complete the AE/SAE section and follow instructions in operations manual regarding Adverse event reporting to [employer] ([facility]).  MEDICATIONS  Medication Are you currently taking [insert name of previous med]? If currently taking: If started since last visit: If stopped since last visit:  ACE Inhibitor / Angiotensin Receptor Blocker (ARB) / Angiotensin Receptor Neprilysin inhibitor (ARNi) [] No >  [x] Yes > [] Stopped since last visit [] Never prescribed [] Started since last visit [x] Same medication as last     visit [] Different  medication than       last visit [Study personnel to answer]  Documented in EHR? [] No  [x] Yes   If no, Verified by: [] Photo of bottle [] Copy of rx [] Dispensing       pharmacy [] Prescribing provider [] Other (specify):                    Date started:        / /             MM DD YYYY Who prescribed this medication for you? [] Cardiology provider > [] Study clinic [] Outside clinic [] Endocrinology provider [] Primary care provider [] Other provider > Specify:                             [] Unknown Date discontinued:        / /             MM DD YYYY Why did you stop taking this medication? (  check all that apply) [] Allergic reaction [] Medication side effects [] Unable to       adhere/monitor [] Had an      operation/procedure      that required      stopping it [] Unable to afford it [] No longer wants        to take        this medication [] Provider decision [] Pregnancy [] Other (specify: ) [] Unknown Reason   If started or changed  What medication are you taking now? [] Benazepril (Lotensin) [] Captopril (Capoten) [] Enalapril (Vasotec) [] Fosinopril (Monopril) [x] Lisinopril (Zestril, Prinivil) [] Quinapril (Accupril) [] Ramipril (Altace) [] Azilsartan (Edarbi) [] Candesartan (Atacand) [] Irbesartan (Avapro) [] Losartan (Cozaar) [] Olmesartan (Benicar) [] Telmisartan (Micardis) [] Valsartan (Diovan) [] Sacrubitril/Valsartan )      Medication Are you currently taking [insert name of previous med]? If currently taking: If started since last visit: If stopped since last visit:  Statin []  No >  [x] Yes > [] Stopped since last visit [] Never prescribed [] Started since last visit [x] Same medication as last        visit [] Different medication or dose     than last visit [Study personnel to answer]  Documented in EHR? [] No [x] Yes    If no, Verified by: [] Photo of bottle [] Copy of rx [] Dispensing pharmacy [] Prescribing provider [] Other (specify):                     Date started:        / /             MM DD YYYY Who prescribed this medication for you? [] Cardiology provider  [] Study clinic [] Outside clinic [] Endocrinology provider [] Primary care provider [] Other provider  Specify:                             [] Unknown Date discontinued:        / /             MM DD YYYY Why did you stop taking this medication? (check all that apply) [] Allergic reaction [] Medication side effects  [] Muscle aches [] Weakness [] Joint pain [] Cognitive symptoms [] Other (specify):                          [] Unable to        adhere/monitor [] Had an    operation/procedure    that required stopping it [] Unable to afford it [] No longer wants       to take this            medication [] Provider decision [] Pregnancy [] Other (specify: ) [] Unknown Reason   If started or changed  What medication are you taking now? [] Atorvastatin (Lipitor) [] Fluvastatin (Lescol) [] Lovastatin (Mevacor) [] Pravastatin (Pravachol) [x] Rosuvastatin (Crestor) [] Simvastatin (Zocor) [] Pitatavastatin (Livalo)  Dose: [] 1 mg  [] 10 mg [] 2 mg  []  20 mg [] 3 mg  []  40 mg [] 4 mg  []  60 mg [] 5 mg  []  80 mg  Frequency: [] Daily []  Less than daily      Medication Are you currently taking [insert name of previous med]? If currently taking: If started since last visit: If stopped since last visit:  SGLT2 Inhibitor [] No   [x] Yes [] Stopped since last visit [] Never prescribed [x] Started since last visit [] Same medication as last      visit [] Different medication than last visit [Study personnel to answer]  Documented in EHR? [] No [x] Yes   If no, Verified by: [] Photo of bottle [] Copy of rx [] Dispensing  pharmacy [] Prescribing       provider [] Other (specify):                    Date started:        / /             MM DD YYYY Who prescribed this medication for you? [] Cardiology provider  [] Study clinic [] Outside clinic [] Endocrinology  provider [] Primary care provider [] Other provider  Specify:                             [] Unknown Date discontinued:        / /             MM DD YYYY Why did you stop taking this medication? (check all that apply) [] Allergic reaction [] Medication side effects [] Unable to       adhere/monitor [] Had an        operation/procedure         that required           stopping it [] Patient unable to       afford it [] Patient no longer       wants to       take this medication [] Provider decision [] Pregnancy [] Other (specify: ) [] Unknown Reason   If started or changed  What medication are you taking now? [] Canaglifozin (Invokana) [] Dapagliflozin (Farxiga) [x] Empaglifozin (Jardiance) [] Ertugliflozin (Steglatro)     GLP1 Receptor Agonist [x] No   [] Yes [] Stopped since last visit [] Never prescribed [] Started since last visit [] Same medication as last      visit [] Different medication than       last visit [Study personnel to answer]  Documented in EHR? [] No [] Yes   If no, Verified by: [] Photo of bottle [] Copy of rx [] Dispensing       pharmacy [] Prescribing       provider [] Other (specify):                    Date started:        / /             MM DD YYYY Who prescribed this medication for you? [] Cardiology provider  [] Study clinic [] Outside clinic [] Endocrinology provider [] Primary care provider [] Other provider  Specify:                             [] Unknown Date discontinued:        / /             MM DD YYYY Why did you stop taking this medication? (check all that apply) [] Allergic reaction [] Medication side effects [] Unable to      adhere/monitor [] Had an    operation/procedure    that required stopping it [] Patient unable to        afford it [] Patient no longer        wants to take this        medication [] Provider decision [] Pregnancy [] Other (specify: ) [] Unknown Reason   If started or changed  What medication are you taking  now? [] Albiglutide (Tanzeum) [] Dulaglutide (Trulicity) [] Exanatide (Byetta, Bydureon) [] Liraglutide (Victoza, Saxenda) [] Lixisenatide (Adlyxin) [] Semaglutice (Ozempic)     03/04/2019 (EDC Release)

## 2020-03-18 ENCOUNTER — Other Ambulatory Visit: Payer: Self-pay | Admitting: Cardiovascular Disease

## 2020-04-15 ENCOUNTER — Encounter: Payer: Self-pay | Admitting: *Deleted

## 2020-04-15 DIAGNOSIS — Z006 Encounter for examination for normal comparison and control in clinical research program: Secondary | ICD-10-CM

## 2020-04-15 NOTE — Research (Addendum)
COORDINATE-Diabetes 12 Month CASE REPORT FORM (Intervention) -EHR Site #:   [id number]              Patient ID:          [id number]   12 MONTH EHR REVIEW  Medical Record Check Date 03/29/2020   Vital Status '[x]'$ Patient Alive >Date last known alive per EHR:   '[]'$ Patient Dead >> Complete Death Form  '[]'$ Unknown   CLINICAL EVENTS / PROCEDURES  Hospitalization since last visit? (>=24 hour stay) '[x]'$ No '[]'$ Yes  >> if yes, Complete the following  Date of hospital admission:        / /             MM DD YYYY  Primary discharge diagnosis:  *Complete appropriate event validation form '[]'$ acute myocardial infarction (heart attack)* '[]'$ stroke* '[]'$ heart failure* '[]'$ coronary revascularization* '[]'$ peripheral revascularization* '[]'$ cerebral revascularization* '[]'$ diabetes (e.g. hypoglycemia, DKA) '[]'$ renal failure '[]'$ amputation '[]'$ other cardiovascular reason '[]'$ other NON-cardiovascular reason '[]'$ unknown  Other diagnoses not documented above: (check all that apply)  *Complete appropriate event validation form '[]'$ acute myocardial infarction (heart attack)* '[]'$ stroke* '[]'$ heart failure* '[]'$ coronary revascularization* '[]'$ peripheral revascularization* '[]'$ cerebral revascularization* '[]'$ diabetes (e.g. hypoglycemia, DKA) '[]'$ renal failure '[]'$ amputation '[]'$ other cardiovascular reason '[]'$ other NON-cardiovascular reason '[]'$ unknown  Were any of the following outpatient procedures done since the last visit? (I.e. procedures not captured above)  Coronary revascularization '[x]'$ No '[]'$ Yes >IF YES, Date / /             MM DD YYYY  Peripheral revascularization '[x]'$ No '[]'$ Yes > IF YES, Date / /             MM DD YYYY  Cerebral revascularization '[x]'$ No '[]'$ Yes > IF YES, Date / /             MM DD YYYY  Extremity amputation    '[x]'$ No '[]'$ Yes >IF YES, Date / /             MM      DD       YYYY  Renal replacement therapy (i.e. dialysis)    '[]'$ No '[]'$ Yes > IF YES, Date of initiation / /             MM      DD       YYYY   **EDC will allow for collection  of multiple hospitalizations and procedures   MEDICATIONS  Medication Currently Prescribed? If started since last visit: If not started since last visit: If stopped since last visit:  Cardiac Medications  ACE Inhibitor / Angiotensin Receptor Blocker (ARB) / Angiotensin Receptor Neprilysin inhibitor (ARNi)  '[x]'$ No >  '[]'$ Yes > Since last visit, medication was: '[]'$ Stopped '[]'$ Not started '[]'$ Started '[x]'$ Continued same medication '[]'$ Continued with medication changes Date started:        / /             MM DD YYYY Who prescribed? '[]'$ Cardiology provider  '[]'$ Study clinic '[]'$ Outside clinic '[]'$ Endocrinology provider '[]'$ Primary care provider '[]'$ Other provider  Specify:                             '[]'$ Unknown Reason (check all that apply): '[]'$ History of swelling around lips, eyes or face '[]'$ Feeling dizzy/lightheaded '[]'$ Low blood pressure '[]'$ Poor or fluctuating kidney function '[]'$ High potassium '[]'$ Patient has experienced other side effects to this medication  before '[]'$ Patient will be unable to adhere/monitor '[]'$ Patient unable to afford it '[]'$ Patient does not want to      take this medication '[]'$ Pregnancy '[]'$ Other (specify: ) '[]'$ Unknown Reason Date  discontinued:        / /             MM DD YYYY Reason (check all that apply): [] Swelling around lips, eyes       or face [] Feeling dizzy/lightheaded [] Low blood pressure [] Poor or fluctuating kidney function [] High potassium [] Other medication side       effects [] Patient unable to        adhere/monitor [] Had an       operation/procedure that        required stopping it [] Patient unable to afford it [] Patient no longer wants       to take this medication [] Pregnancy [] Other (specify: ) [] Unknown Reason   If started or changed  Medication Name: [] Benazepril (Lotensin) [] Captopril (Capoten) [] Enalapril (Vasotec) [] Fosinopril (Monopril) [] Lisinopril (Zestril, Prinivil) [] Quinapril (Accupril) [] Ramipril (Altace) [] Azilsartan  (Edarbi) [] Candesartan (Atacand) [] Irbesartan (Avapro) [] Losartan (Cozaar) [] Olmesartan (Benicar) [] Telmisartan (Micardis) [] Valsartan (Diovan) [] Sacrubitril/Valsartan (Entresto)     Beta Blocker [x] No []  Yes > [] Acebutolol (Sectral) [] Bisoprolol (Zebeta) [] Carvedilol (Coreg) [] Labetalol (Trandate,      Normodyne) [] Metoprolol succinate (Toprol) [] Metoprolol tartrate       (Lopressor) [] Nadolol (Corgard) [] Nebivolol (Bystolic) [] Propranolol (Inderal) [] Sotalol (Betapace)     Medication Currently Prescribed? If started since last visit: If not started since last visit: If stopped since last visit:  Aldosterone Antagonist [x] No [] Yes > [] Amiloride [] Eplerenone (Inspra) [] Spirinolactone (Aldactone) [] Traimterene (Dyrenium)   Calcium Channel Blocker [x] No []  Yes > Medication Name: [] Amlodipine (Norvasc) [] Diltiazem (Cardizem) [] Felodipine (Plendil) [] Nifedipine (Procardia) [] Verapamil (Calan)   Diuretic Loop [x] No []  Yes > Medication Name: [] Bumetanide (Bumex) [] Ethacrynic acid (Edecrin) [] Furosemide (Lasix) [] Torsemide (Demadex)   Diuretic Thiazide- type [x] No []  Yes > Medication Name: [] Chlorothiazide      [] Chlorthalidone [] Hydrochlorothiazide [] Indapamide [] Metolazone   Anticoagulation Therapy (other than Warfarin) [x] No []  Yes > Medication Name: [] Apixaban (Eliquis) [] Edoxaban (Lixiana) [] Rivaroxaban Alen Blew) [] Dabigatran (Redaxa)   Warfarin [x] No []  Yes    Antiplatelet Agent (including aspirin) [] No [x]  Yes > Medication Name (check all that apply): [x] Aspirin [] Clopidogrel (Plavix) [] Prasugrel (Effient) [] Ticagrelor (Brilinta) [] Ticlopidine (Ticlid) [] Dipyridamole (Persantine)    Medication Currently Prescribed? If started since last visit: If not started since last visit: If stopped since last visit:  Statin  [] No >  [x] Yes > Since last visit, medication was: [] Stopped [] Not started [] Started [x] Continued same  medication and dose [] Continued with dose or medication changes Date started:        / /             MM DD YYYY Who prescribed? [] Cardiology provider [] Endocrinology provider [] Primary care provider [] Other provider  Specify:                             [] Unknown Reason (check all that apply): [] History of Rhabdomyolysis [] LDL-cholesterol already       <70 [] Muscle      aches/pain/weakness [] Mental       fogginess/memory loss [] Liver dysfunction [] Patient has  experienced other side   effects to this medication before [] Patient will be unable to adhere/monitor [] Patient unable to afford it [] Patient does not want to take this medication [] Pregnancy [] Other (specify: ) [] Unknown Reason Date discontinued:        / /             MM DD YYYY Reason (check all that apply): [] Rhabdomyolysis [] Muscle aches/pain/weakness [] Mental fogginess/memory loss [] Liver dysfunction [] Other medication side effects [] Patient unable to  adhere/monitor '[]'$ Patient unable to afford it '[]'$ Patient no longer wants to take       this medication '[]'$ Pregnancy '[]'$ Other (specify: ) '[]'$ Unknown Reason   If started or changed  Medication Name: '[]'$ Atorvastatin (Lipitor) '[]'$ Fluvastatin (Lescol) '[]'$ Lovastatin (Mevacor) '[]'$ Pravastatin (Pravachol) '[]'$ Rosuvastatin (Crestor) '[]'$ Simvastatin (Zocor) '[]'$ Pitatavastatin (Livalo)  Dose: '[]'$ 1 mg '[]'$ 10 mg '[]'$ 2 mg '[]'$  20 mg '[]'$ 3 mg '[]'$  40 mg '[]'$ 4 mg '[]'$  60 mg '[]'$ 5 mg '[]'$  80 mg  Frequency: '[]'$ Daily  '[]'$ Less than daily      Does the patient have statin intolerance that prevents the use of maximum dose of high potency statin? '[x]'$ No '[]'$ Yes >IF YES, Complete Statin Intolerance form   Non-statin lipid lowering therapy '[]'$ No '[x]'$ Yes > Medication Name (check all that apply): '[]'$ Colesevelam (Welchol) '[]'$ Ezetimibe (Zetia) '[]'$ Fibrate '[]'$ Niacin '[]'$ PCSK9 inhibitor '[x]'$ Omega 3 acid ethyl esters (Lovaza) '[]'$ Icosapent Ethyl (Vascepa) '[]'$ Over the counter omega 3 fatty acid or fish  oil supplement     Medication Currently Prescribed? If started since last visit: If not started since last visit: If stopped since last visit:  Diabetes Medications  SGLT2 Inhibitor  '[x]'$ No >  '[]'$ Yes > Since last visit, medication was: '[]'$ Stopped '[x]'$ Not started '[]'$ Started '[]'$ Continued same medication '[]'$ Continued with medication changes Date started:        / /             MM DD YYYY Who prescribed? '[]'$ Cardiology provider  '[]'$ Study clinic '[]'$ Outside clinic '[]'$ Endocrinology      provider '[]'$ Primary care provider '[]'$ Other provider  Specify:                             '[]'$ Unknown Reason (check all that apply): '[]'$ eGFR <45 '[x]'$ HbA1c<7% on metformin monotherapy OR already on GLP1RA and do not need to start another anti- hyperglycemic '[]'$ Already dehydrated '[]'$ Low blood pressure '[]'$ High risk of Hypoglycemia '[]'$ Prior DKA '[]'$ Recurrent mycotic genital infections '[]'$ History of or at risk for amputation '[]'$ Patient has experienced other side effects to this medication before '[]'$ Patient will be unable to adhere/monitor '[]'$ Patient unable to afford it '[]'$ Patient does not want to take this medication '[]'$ Pregnancy '[]'$ Other (specify: ) '[]'$ Unknown Reason Date discontinued:        / /             MM DD YYYY Reason (check all that apply  '[]'$ eGFR now <45 '[]'$ Dehydration '[]'$ Low blood pressure '[]'$ Hypoglycemia '[]'$ DKA '[]'$ Mycotic genital infection '[]'$ Amputation '[]'$ Other medication side effects '[]'$ Patient unable    to adhere/monitor  '[]'$ Had an operation/procedure     that required stopping it '[]'$ Patient unable to afford it '[]'$ Patient no longer wants to      take this medication '[]'$ Pregnancy '[]'$ Other (specify: ) '[]'$ Unknown Reason   If started or changed  Medication Name: '[]'$ Canaglifozin (Invokana) '[]'$ Dapagliflozin Wilder Glade) '[]'$ Empaglifozin (Jardiance) '[]'$ Ertugliflozin Actuary)           Medication Currently Prescribed? If started since last visit: If not started since last visit: If stopped since last visit:   GLP1 Receptor Agonist  '[]'$ No >  '[]'$ Yes > Since last visit, medication was: '[]'$ Stopped '[]'$ Not started '[]'$ Started '[]'$ Continued same medication '[]'$ Continued with medication changes Date started:        / /             MM DD YYYY Who prescribed? '[]'$ Cardiology provider  '[]'$ Study clinic '[]'$ Outside clinic '[]'$ Endocrinology       provider '[]'$ Primary care provider '[]'$ Other provider > Specify:                             '[]'$   Unknown Reason (check all that apply): '[]'$ Personal or family history of medullary thyroid cancer '[]'$ MEN2 '[]'$ HbA1c<7% on metformin monotherapy OR already on SGLT2i and do not need to start another anti-hyperglycemic '[]'$ eGFR now <30 '[]'$ High risk of Hypoglycemia '[]'$ History of pancreatitis '[]'$ Significant gastroparesis '[]'$ Prior gastric surgery '[]'$ Patient has experienced other side effects to this medication before '[]'$ Patient will be unable to adhere/monitor '[]'$ Patient unable to afford it '[]'$ Patient does not want to take this medication '[]'$ Pregnancy '[]'$ Other (specify: ) '[]'$ Unknown Reason Date discontinued:        / /             MM DD YYYY Reason (check all that apply): '[]'$ Medullary thyroid cancer '[]'$ MEN2 '[]'$ eGFR now <30 '[]'$ Hypoglycemia '[]'$ Pancreatitis '[]'$ Significant gastroparesis '[]'$ Gastric surgery '[]'$ Other medication side       effects '[]'$   Patient  unable    to adhere/monitor                                  '[]'$ Had an operation/procedure that required stopping it '[]'$ Patient unable to afford it '[]'$ Patient no longer wants to take this medication '[]'$ Pregnancy '[]'$ Other (specify: ) '[]'$ Unknown Reason   If started or changed > Medication Name: '[]'$ Albiglutide (Tanzeum) '[]'$ Dulaglutide (Trulicity) '[]'$ Exanatide (Byetta, Bydureon) '[]'$ Liraglutide (Victoza, Saxenda) '[]'$ Lixisenatide (Adlyxin) '[]'$ Semaglutice (Ozempic)      Medication Currently Prescribed? If started since last visit: If not started since last visit: If stopped since last visit:  Other non Insulin diabetes medications '[]'$ No '[]'$ Yes >  Medication Name (check all that apply): '[]'$ Acarbose (Precose) '[]'$ Miglitol (Glyset) '[]'$ Glimepiride (Amaryl) '[]'$ Glipizide (Amaryl) '[]'$ Glyburide (Diabeta,       Glynase,   Micronase) '[]'$ Metformin (Fortamet,        Glucophage[including XR],        Glumetza, Riomet) '[]'$ Pioglitazone (Actos) '[]'$ Nateglinide (Starlix) '[]'$ Pramlintide (Symilin) '[]'$ Repaglinide (Prandin) '[]'$ Rosiglitazone (Avandia) '[]'$ Alogliptin (Nesina) '[]'$ Linagliptin (Tradjenta) '[]'$ Saxagliptin (Onglyza) '[]'$ Sitagliptin (Januvia) '[]'$ Bromocriptine Quick Release (Cycloset)     Insulin '[]'$ No '[]'$  Yes > total daily dose: units     STATIN INTOLERANCE (PER EHR/OTHER SOURCE DATA)  1. Was CK checked? '[]'$ No '[]'$ Yes   >If yes, select from the following: '[]'$ CK not elevated '[]'$ CK elevated 1-5x upper limit of normal '[]'$ CK elevated >5x upper limit of normal  2. Does the patient have muscle symptoms? '[]'$ No '[]'$ Yes    >If yes, select from the following: Location and pattern of muscle symptoms (select all that apply) '[]'$ Symmetric, hip flexors or thighs '[]'$ Symmetric, calves '[]'$ Symmetric, proximal upper extremity '[]'$ Asymmetric, intermittent, or not specific to any area '[]'$ Unknown   Timing of muscle symptom in relation to starting statin regimen '[]'$ <4 weeks '[]'$ 4-12 weeks '[]'$ >12 weeks '[]'$ Unknown   Timing of muscle symptoms improvement after withdrawal of statin '[]'$ <2 weeks '[]'$ 2-4 weeks '[]'$ No improvement after 4 weeks '[]'$ Unknown  3. Was patient re-challenged with a statin regimen (even if same statin compound or regimen as above)?  '[]'$ No  '[]'$ Yes  '[]'$ Unknown  >If yes, select from the following: Timing of recurrence of similar muscle symptoms in relation to starting second regimen '[]'$ <4 weeks '[]'$ 4-12 weeks '[]'$ >12 weeks '[]'$ Similar symptoms did not recur '[]'$ Unknown   [email or web address]

## 2020-11-17 ENCOUNTER — Other Ambulatory Visit: Payer: Self-pay

## 2020-11-17 ENCOUNTER — Encounter: Payer: Self-pay | Admitting: Cardiovascular Disease

## 2020-11-17 ENCOUNTER — Ambulatory Visit: Payer: Medicare PPO | Admitting: Cardiovascular Disease

## 2020-11-17 ENCOUNTER — Telehealth: Payer: Self-pay | Admitting: Cardiovascular Disease

## 2020-11-17 VITALS — BP 114/66 | HR 68 | Ht 74.0 in | Wt 242.4 lb

## 2020-11-17 DIAGNOSIS — E1169 Type 2 diabetes mellitus with other specified complication: Secondary | ICD-10-CM

## 2020-11-17 DIAGNOSIS — E669 Obesity, unspecified: Secondary | ICD-10-CM

## 2020-11-17 DIAGNOSIS — I25810 Atherosclerosis of coronary artery bypass graft(s) without angina pectoris: Secondary | ICD-10-CM

## 2020-11-17 DIAGNOSIS — Z87898 Personal history of other specified conditions: Secondary | ICD-10-CM

## 2020-11-17 DIAGNOSIS — E785 Hyperlipidemia, unspecified: Secondary | ICD-10-CM | POA: Diagnosis not present

## 2020-11-17 DIAGNOSIS — I10 Essential (primary) hypertension: Secondary | ICD-10-CM | POA: Diagnosis not present

## 2020-11-17 NOTE — Telephone Encounter (Signed)
Patient's wife states the patient was told to stop a medication, but it was not written on his office summary. She would like to know, which medication he is supposed to stop taking.

## 2020-11-17 NOTE — Telephone Encounter (Signed)
Spoke with patient's spouse. Patient is taking both [place], he has active prescriptions for both.

## 2020-11-17 NOTE — Telephone Encounter (Signed)
They are virtually identical drugs. I would discontinue the one that costs more. If they pay the same amount for them and no other reason to prefer one over the other, stop [place] and continue [place].

## 2020-11-17 NOTE — Telephone Encounter (Signed)
Spoke with patient's spouse and informed her of MD recommendations. She is going to check with the pharmacy first.

## 2020-11-17 NOTE — Progress Notes (Signed)
Cardiology Office Note:    Date:  11/17/2020   ID:  Nathan Navarro, DOB [birth year], MRN [id number]  PCP:  Dois Davenport, MD  Cardiologist:  Thurmon Fair, MD    Referring MD: Dois Davenport, MD   Chief Complaint  Patient presents with  . Coronary Artery Disease  s/p CABG  History of Present Illness:    Nathan Navarro is a 79 y.o. male with a hx of CAD with initial bypass surgery 1981 and redo bypass in 2000 (LIMA to LAD, RIMA to RCA, SVG to diagonal, SVG to ramus intermedius), mild obesity, type 2 diabetes mellitus on insulin, hyperlipidemia, hypertension, hypothyroidism and erectile dysfunction. He has mild asymptomatic left ventricular systolic dysfunction (EF 45%) and his most recent functional study in 2012 showed normal perfusion. His ECG stress test shows 1.5-2 millimeter ST segment depression at peak exercise even with normal perfusion pattern.   He remains physically active.  He walked his [place] retriever 4 miles yesterday.  He plays golf 2 or 3 times a week.  He cannot walk at faster speeds on a treadmill due to bilateral knee pain, but he does use a stationary bike regularly chest tightness with activities.  He denies dizziness, syncope, palpitations, intermittent claudication, focal neurological complaints.  He has not had any falls or bleeding problems.  Previous ECG tracings have shown second-degree sinoatrial block and he has first-degree AV block.  He is not taking beta-blockers because of this.  He has not had any symptoms of bradycardia.  Labs performed by Dr. Nadyne Coombes on September 01, 2020 show LDL well within target at 66 (other lipid parameters also acceptable), normal creatinine at 1.1, hemoglobin A1c 7.4%, normal TSH 1.22.   Past Medical History:  Diagnosis Date  . Arthritis   . BPH (benign prostatic hypertrophy)   . Coronary artery disease   . History of stress test 12/15/2011   The post stress myocardial perfusion images show a normal pattern  of perfusion in all regins. The post-stress EF is 45%. Global left ventricular systolic function is mildly reduced. Exercise capacity 9 METS, Exercise capacity is normal. ECG positive for ischemia.. Normal myocardial Perfusion Study.  Marland Kitchen Hx of echocardiogram 09/10/2006   EF 50-55%. The left ventricle is normal in size. Left ventricular systolic function is low normal. Cannot r/o mild hypokinesis of the entire anterior anterolateral and septal wall. Septal motion consistent with post operative state. Poor echo window.  . Obesity   . Other and unspecified hyperlipidemia   . Sleep apnea 10 yrs ago   mild sleep apnea no cpap needed  . Type 2 diabetes mellitus (HCC)   . Unspecified hypothyroidism     Past Surgical History:  Procedure Laterality Date  . CARDIAC CATHETERIZATION  11/25/1998   Multivessel negative coronary artery disease. 100% occlusion of the proximal left anterior descending. Greater than 75% stenosis of an optional circumflex. 80% stenosis of proximal right coronary artery.Patent sequential saphenous vein graft to diagonal and left anterior descending.   Marland Kitchen CARDIAC CATHETERIZATION  07/27/1991   Good ventricular systolic function with an EFof 63% and mild anterolateral wall hypokinesis. Suspected occlusion of the proximal LAD with patent vein graft to the diagonal branch but closure of the vein graft to the LAD. Occlusion of the vein graft to the optional diagonal or intermedius ramus branch  . CORONARY ARTERY BYPASS GRAFT  1981 x 3, 2000 repair 2 and  did 2 more   the second procedurenin 2000(LIMA to LAD, RIMA to right coronary  artery, SVG to diagonal, SVG to ramus intermedius)  . JOINT REPLACEMENT     LEFT TOTAL KNEE 03/2012  . KNEE ARTHROSCOPY  2009   R knee  . KNEE CLOSED REDUCTION  07/01/2012   Procedure: CLOSED MANIPULATION KNEE;  Surgeon: Loanne DrillingFrank V Aluisio, MD;  Location: [facility];  Service: Orthopedics;  Laterality: Left;  . LUMBAR SPINE SURGERY  1986  . TOTAL KNEE ARTHROPLASTY   04/22/2012   Procedure: TOTAL KNEE ARTHROPLASTY;  Surgeon: Loanne DrillingFrank V Aluisio, MD;  Location: [facility];  Service: Orthopedics;  Laterality: Left;  . TOTAL KNEE ARTHROPLASTY  09/16/2012   Procedure: TOTAL KNEE ARTHROPLASTY;  Surgeon: Loanne DrillingFrank V Aluisio, MD;  Location: [facility];  Service: Orthopedics;  Laterality: Right;    Current Medications: Current Meds  Medication Sig  . aspirin 81 MG tablet Take 81 mg by mouth daily.  . B-D UF III MINI PEN NEEDLES 31G X 5 MM MISC USE AS DIRECTED  . benzonatate (TESSALON PERLES) 100 MG capsule Take 1-2 pills 3 times daily if needed for daytime cough  . fish oil-omega-3 fatty acids 1000 MG capsule Take 2 g by mouth daily.  . fluticasone (FLONASE) 50 MCG/ACT nasal spray Place 2 sprays into both nostrils daily.  Marland Kitchen. gabapentin (NEURONTIN) 100 MG capsule TAKE 1 CAPSULE BY MOUTH AT BEDTIME.  . insulin lispro (HUMALOG KWIKPEN) 100 UNIT/ML KiwkPen Inject 0.1 mLs (10 Units total) into the skin daily before supper.  Marland Kitchen. LANTUS SOLOSTAR 100 UNIT/ML Solostar Pen INJECT 40 UNITS INTO THE SKIN DAILY. **NEEDS OFFICE VISIT  . levothyroxine (SYNTHROID, LEVOTHROID) 150 MCG tablet Take 1 tablet (150 mcg total) by mouth daily.  . metFORMIN (GLUCOPHAGE) 1000 MG tablet TAKE 1 TABLET BY MOUTH 2 TIMES DAILY. NEED OFFICE VISIT FOR FURTHER REFILLS  . Multiple Vitamin (MULTIVITAMIN) tablet Take 1 tablet by mouth daily.  . nebivolol (BYSTOLIC) 5 MG tablet TAKE 1 TABLET (5MG  TOTAL) BY MOUTH DAILY  . rosuvastatin (CRESTOR) 40 MG tablet Lisinopril 5 mg tablet TAKE 1 TABLE BY MOUTH DAILY  TAKE 1 TABLE BY MOUTH DAILY  . sildenafil (REVATIO) 20 MG tablet Take 2- 3 tablets by mouth prior to sexual activity. Limit use to once a day.  . VOLTAREN 1 % GEL   . zoster vaccine live, PF, (ZOSTAVAX) [id number] UNT/0.65ML injection Inject 19,400 Units into the skin once.     Allergies:   Morphine and related   Social History   Socioeconomic History  . Marital status: Married    Spouse name: Aram BeechamCynthia  .  Number of children: 2  . Years of education: Not on file  . Highest education level: Not on file  Occupational History  . Not on file  Tobacco Use  . Smoking status: Never Smoker  . Smokeless tobacco: Current User    Types: Chew  Substance and Sexual Activity  . Alcohol use: Yes    Alcohol/week: 1.0 standard drink    Types: 1 Shots of liquor per week    Comment: occ  . Drug use: No  . Sexual activity: Yes    Comment: number of sex partners in the last 12 months 1  Other Topics Concern  . Not on file  Social History Narrative   Exercise cardio 3 x a week for one hour   Lives with his wife.   Both children are grown and live independently.   Social Determinants of Health   Financial Resource Strain:   . Difficulty of Paying Living Expenses: Not on file  Food  Insecurity:   . Worried About Programme researcher, broadcasting/film/video in the Last Year: Not on file  . Ran Out of Food in the Last Year: Not on file  Transportation Needs:   . Lack of Transportation (Medical): Not on file  . Lack of Transportation (Non-Medical): Not on file  Physical Activity:   . Days of Exercise per Week: Not on file  . Minutes of Exercise per Session: Not on file  Stress:   . Feeling of Stress : Not on file  Social Connections:   . Frequency of Communication with Friends and Family: Not on file  . Frequency of Social Gatherings with Friends and Family: Not on file  . Attends Religious Services: Not on file  . Active Member of Clubs or Organizations: Not on file  . Attends Banker Meetings: Not on file  . Marital Status: Not on file     Family History: The patient's family history includes Heart disease (age of onset: 22) in his brother; Heart disease (age of onset: 27) in his brother and father; Hyperlipidemia in his son. ROS:   Please see the history of present illness.    All other systems are reviewed and are negative.   EKGs/Labs/Other Studies Reviewed:    EKG:  EKG is  ordered today.  It  shows sinus rhythm with first-degree AV block and normal repolarization pattern. Recent Labs: No results found for requested labs within last 8760 hours.  Recent Lipid Panel    Component Value Date/Time   CHOL 121 (L) 11/23/2015 1020   TRIG 79 11/23/2015 1020   HDL 37 (L) 11/23/2015 1020   CHOLHDL 3.3 11/23/2015 1020   VLDL 16 11/23/2015 1020   LDLCALC 68 11/23/2015 1020   09/01/2020 Total cholesterol 120, triglycerides 69, HDL 40, LDL 66 Hemoglobin X3K 7.4%, creatinine 1.11, hemoglobin 16.0, TSH 1.22  Physical Exam:    VS:  BP 114/66   Pulse 68   Ht 6\' 2"  (1.88 m)   Wt 242 lb 6.4 oz (110 kg)   SpO2 97%   BMI 31.12 kg/m     Wt Readings from Last 3 Encounters:  11/17/20 242 lb 6.4 oz (110 kg)  11/06/19 251 lb 3.2 oz (113.9 kg)  02/28/19 256 lb 6.4 oz (116.3 kg)     General: Alert, oriented x3, no distress, mildly obese, but appears fit and fairly muscular for his age. Head: no evidence of trauma, PERRL, EOMI, no exophtalmos or lid lag, no myxedema, no xanthelasma; normal ears, nose and oropharynx Neck: normal jugular venous pulsations and no hepatojugular reflux; brisk carotid pulses without delay and no carotid bruits Chest: clear to auscultation, no signs of consolidation by percussion or palpation, normal fremitus, symmetrical and full respiratory excursions Cardiovascular: normal position and quality of the apical impulse, regular rhythm, normal first and second heart sounds, no murmurs, rubs or gallops Abdomen: no tenderness or distention, no masses by palpation, no abnormal pulsatility or arterial bruits, normal bowel sounds, no hepatosplenomegaly Extremities: no clubbing, cyanosis or edema; 2+ radial, ulnar and brachial pulses bilaterally; 2+ right femoral, posterior tibial and dorsalis pedis pulses; 2+ left femoral, posterior tibial and dorsalis pedis pulses; no subclavian or femoral bruits Neurological: grossly nonfocal Psych: Normal mood and affect    ASSESSMENT:     1. Coronary artery disease involving coronary bypass graft of native heart without angina pectoris   2. Essential hypertension   3. History of bradycardia   4. Dyslipidemia (high LDL; low HDL)  5. Diabetes mellitus type 2 in obese (HCC)    PLAN:    In order of problems listed above:  1. CAD s/p redo CABG: Asymptomatic despite being very physically active.  He has a "false positive" ECG response to exercise and cannot easily walk on the treadmill so routine treadmill stress test.  Unlikely to be of any help.  If he develops any symptoms suggestive of recurrent coronary sufficiency he should undergo a Lexiscan Myoview study. 2. HTN: Excellent control. 3. Bradycardia: Has had bradycardia on ECG, but no symptoms thereof.  Avoid beta-blockers. 4. Hyperlipidemia: All lipid parameters are within acceptable range on recent labs 5. DM: His medicine list has both [place] on it, he thinks he is only taking the [place].  Diabetes control is acceptable at 7.4%, although not ideal.    Medication Adjustments/Labs and Tests Ordered: Current medicines are reviewed at length with the patient today.  Concerns regarding medicines are outlined above.  Orders Placed This Encounter  Procedures  . EKG 12-Lead   No orders of the defined types were placed in this encounter.   Signed, Thurmon Fair, MD  11/17/2020 9:45 AM     [facility]

## 2020-11-17 NOTE — Patient Instructions (Signed)
Medication Instructions:  No changes *If you need a refill on your cardiac medications before your next appointment, please call your pharmacy*   Lab Work: None ordered If you have labs (blood work) drawn today and your tests are completely normal, you will receive your results only by: MyChart Message (if you have MyChart) OR A paper copy in the mail If you have any lab test that is abnormal or we need to change your treatment, we will call you to review the results.   Testing/Procedures: None ordered   Follow-Up: At [facility], you and your health needs are our priority.  As part of our continuing mission to provide you with exceptional heart care, we have created designated Provider Care Teams.  These Care Teams include your primary Cardiologist (physician) and Advanced Practice Providers (APPs -  Physician Assistants and Nurse Practitioners) who all work together to provide you with the care you need, when you need it.  We recommend signing up for the patient portal called "MyChart".  Sign up information is provided on this After Visit Summary.  MyChart is used to connect with patients for Virtual Visits (Telemedicine).  Patients are able to view lab/test results, encounter notes, upcoming appointments, etc.  Non-urgent messages can be sent to your provider as well.   To learn more about what you can do with MyChart, go to [email or web address].    Your next appointment:   12 month(s)  The format for your next appointment:   In Person  Provider:   You may see Dr. Croitoru or one of the following Advanced Practice Providers on your designated Care Team:   Hao Meng, PA-C Angela Duke, PA-C or  Krista Kroeger, PA-C    

## 2024-06-26 ENCOUNTER — Other Ambulatory Visit (HOSPITAL_COMMUNITY): Payer: Self-pay | Admitting: Family Medicine

## 2024-06-26 DIAGNOSIS — I7090 Unspecified atherosclerosis: Secondary | ICD-10-CM

## 2024-06-26 DIAGNOSIS — I2510 Atherosclerotic heart disease of native coronary artery without angina pectoris: Secondary | ICD-10-CM

## 2024-06-26 DIAGNOSIS — R944 Abnormal results of kidney function studies: Secondary | ICD-10-CM

## 2024-07-17 ENCOUNTER — Ambulatory Visit (HOSPITAL_COMMUNITY)
Admission: RE | Admit: 2024-07-17 | Discharge: 2024-07-17 | Disposition: A | Discharge status: Home / Self Care | Source: Ambulatory Visit | Attending: Family Medicine | Admitting: Family Medicine

## 2024-07-17 DIAGNOSIS — R944 Abnormal results of kidney function studies: Secondary | ICD-10-CM | POA: Diagnosis present

## 2024-07-17 DIAGNOSIS — I2510 Atherosclerotic heart disease of native coronary artery without angina pectoris: Secondary | ICD-10-CM | POA: Insufficient documentation
